# Patient Record
Sex: Female | Born: 1980
Health system: Southern US, Community
[De-identification: ages and names within clinical notes are randomized; demographics above are authoritative.]

## PROBLEM LIST (undated history)

## (undated) DIAGNOSIS — R87619 Unspecified abnormal cytological findings in specimens from cervix uteri: Secondary | ICD-10-CM

## (undated) DIAGNOSIS — M069 Rheumatoid arthritis, unspecified: Secondary | ICD-10-CM

## (undated) DIAGNOSIS — F32A Depression, unspecified: Secondary | ICD-10-CM

## (undated) DIAGNOSIS — I10 Essential (primary) hypertension: Secondary | ICD-10-CM

## (undated) DIAGNOSIS — G43909 Migraine, unspecified, not intractable, without status migrainosus: Secondary | ICD-10-CM

## (undated) DIAGNOSIS — J189 Pneumonia, unspecified organism: Secondary | ICD-10-CM

## (undated) DIAGNOSIS — I7300 Raynaud's syndrome without gangrene: Secondary | ICD-10-CM

## (undated) DIAGNOSIS — IMO0002 Reserved for concepts with insufficient information to code with codable children: Secondary | ICD-10-CM

## (undated) DIAGNOSIS — R569 Unspecified convulsions: Secondary | ICD-10-CM

## (undated) HISTORY — PX: WISDOM TOOTH EXTRACTION: SHX21

## (undated) HISTORY — PX: BREAST ENHANCEMENT SURGERY: SHX7

## (undated) HISTORY — PX: ANKLE SURGERY: SHX546

## (undated) HISTORY — PX: AUGMENTATION MAMMAPLASTY: SUR837

## (undated) HISTORY — PX: VAGINAL HYSTERECTOMY: SHX2639

## (undated) HISTORY — DX: Rheumatoid arthritis, unspecified: M06.9

## (undated) HISTORY — PX: OTHER SURGICAL HISTORY: SHX169

## (undated) HISTORY — PX: UPPER GASTROINTESTINAL ENDOSCOPY: SHX188

## (undated) HISTORY — DX: Pneumonia, unspecified organism: J18.9

## (undated) HISTORY — DX: Unspecified convulsions: R56.9

## (undated) HISTORY — PX: TUBAL LIGATION: SHX77

## (undated) HISTORY — DX: Depression, unspecified: F32.A

---
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
## 2002-09-19 ENCOUNTER — Other Ambulatory Visit: Admission: RE | Admit: 2002-09-19 | Discharge: 2002-09-19 | Payer: Self-pay | Admitting: Obstetrics and Gynecology

## 2004-11-12 ENCOUNTER — Other Ambulatory Visit: Admission: RE | Admit: 2004-11-12 | Discharge: 2004-11-12 | Payer: Self-pay | Admitting: Obstetrics and Gynecology

## 2006-11-01 ENCOUNTER — Ambulatory Visit (HOSPITAL_COMMUNITY): Admission: RE | Admit: 2006-11-01 | Discharge: 2006-11-01 | Payer: Self-pay | Admitting: Obstetrics and Gynecology

## 2007-10-24 ENCOUNTER — Inpatient Hospital Stay (HOSPITAL_COMMUNITY): Admission: AD | Admit: 2007-10-24 | Discharge: 2007-10-27 | Payer: Self-pay | Admitting: Obstetrics and Gynecology

## 2010-07-27 HISTORY — PX: OTHER SURGICAL HISTORY: SHX169

## 2011-02-12 ENCOUNTER — Other Ambulatory Visit (HOSPITAL_COMMUNITY): Payer: Self-pay | Admitting: Gynecology

## 2011-02-12 DIAGNOSIS — N979 Female infertility, unspecified: Secondary | ICD-10-CM

## 2011-02-17 ENCOUNTER — Ambulatory Visit (HOSPITAL_COMMUNITY)
Admission: RE | Admit: 2011-02-17 | Discharge: 2011-02-17 | Disposition: A | Discharge status: Home / Self Care | Payer: 59 | Source: Ambulatory Visit | Attending: Gynecology | Admitting: Gynecology

## 2011-02-17 DIAGNOSIS — N979 Female infertility, unspecified: Secondary | ICD-10-CM | POA: Insufficient documentation

## 2011-02-17 MED ORDER — IOHEXOL 300 MG/ML  SOLN: 30.0000 mL | Freq: Once | INTRAMUSCULAR | Status: AC | PRN

## 2011-04-20 LAB — CBC
Hemoglobin: 10.9 — ABNORMAL LOW
MCHC: 35.9
MCV: 94.4
RBC: 3.23 — ABNORMAL LOW

## 2011-04-20 LAB — URINALYSIS, ROUTINE W REFLEX MICROSCOPIC
Bilirubin Urine: NEGATIVE
Hgb urine dipstick: NEGATIVE
Specific Gravity, Urine: 1.015
pH: 7

## 2011-04-21 LAB — CBC
HCT: 27.5 — ABNORMAL LOW
MCHC: 35.8
MCV: 94.1
Platelets: 139 — ABNORMAL LOW
RDW: 11.9

## 2011-09-11 ENCOUNTER — Other Ambulatory Visit: Payer: Self-pay

## 2011-09-11 LAB — OB RESULTS CONSOLE GC/CHLAMYDIA: Chlamydia: NEGATIVE

## 2011-09-11 LAB — OB RESULTS CONSOLE HEPATITIS B SURFACE ANTIGEN: Hepatitis B Surface Ag: NEGATIVE

## 2011-09-11 LAB — OB RESULTS CONSOLE GBS: GBS: POSITIVE

## 2011-09-11 LAB — OB RESULTS CONSOLE ANTIBODY SCREEN: Antibody Screen: NEGATIVE

## 2011-09-11 LAB — OB RESULTS CONSOLE ABO/RH: RH Type: POSITIVE

## 2011-11-10 ENCOUNTER — Other Ambulatory Visit (HOSPITAL_COMMUNITY): Payer: Self-pay | Admitting: Obstetrics and Gynecology

## 2011-11-10 DIAGNOSIS — Z0489 Encounter for examination and observation for other specified reasons: Secondary | ICD-10-CM

## 2011-11-10 DIAGNOSIS — IMO0002 Reserved for concepts with insufficient information to code with codable children: Secondary | ICD-10-CM

## 2011-11-24 ENCOUNTER — Encounter (HOSPITAL_COMMUNITY): Payer: Self-pay

## 2011-11-24 ENCOUNTER — Ambulatory Visit (HOSPITAL_COMMUNITY)
Admission: RE | Admit: 2011-11-24 | Discharge: 2011-11-24 | Disposition: A | Discharge status: Home / Self Care | Payer: 59 | Source: Ambulatory Visit | Attending: Obstetrics and Gynecology | Admitting: Obstetrics and Gynecology

## 2011-11-24 DIAGNOSIS — Z1389 Encounter for screening for other disorder: Secondary | ICD-10-CM | POA: Insufficient documentation

## 2011-11-24 DIAGNOSIS — Z0489 Encounter for examination and observation for other specified reasons: Secondary | ICD-10-CM

## 2011-11-24 DIAGNOSIS — Z363 Encounter for antenatal screening for malformations: Secondary | ICD-10-CM | POA: Insufficient documentation

## 2011-11-24 DIAGNOSIS — O43899 Other placental disorders, unspecified trimester: Secondary | ICD-10-CM | POA: Insufficient documentation

## 2011-11-24 DIAGNOSIS — IMO0002 Reserved for concepts with insufficient information to code with codable children: Secondary | ICD-10-CM

## 2011-11-24 DIAGNOSIS — O358XX0 Maternal care for other (suspected) fetal abnormality and damage, not applicable or unspecified: Secondary | ICD-10-CM | POA: Insufficient documentation

## 2011-11-24 HISTORY — DX: Migraine, unspecified, not intractable, without status migrainosus: G43.909

## 2011-11-24 HISTORY — DX: Unspecified abnormal cytological findings in specimens from cervix uteri: R87.619

## 2011-11-24 HISTORY — DX: Reserved for concepts with insufficient information to code with codable children: IMO0002

## 2012-03-22 ENCOUNTER — Inpatient Hospital Stay (HOSPITAL_COMMUNITY)
Admission: AD | Admit: 2012-03-22 | Discharge: 2012-03-25 | DRG: 775 | Disposition: A | Discharge status: Home / Self Care | Payer: 59 | Source: Ambulatory Visit | Attending: Obstetrics and Gynecology | Admitting: Obstetrics and Gynecology

## 2012-03-22 ENCOUNTER — Inpatient Hospital Stay (HOSPITAL_COMMUNITY): Payer: 59

## 2012-03-22 ENCOUNTER — Encounter (HOSPITAL_COMMUNITY): Payer: Self-pay | Admitting: *Deleted

## 2012-03-22 DIAGNOSIS — O99892 Other specified diseases and conditions complicating childbirth: Secondary | ICD-10-CM | POA: Diagnosis present

## 2012-03-22 DIAGNOSIS — O26899 Other specified pregnancy related conditions, unspecified trimester: Secondary | ICD-10-CM

## 2012-03-22 DIAGNOSIS — Z22330 Carrier of Group B streptococcus: Secondary | ICD-10-CM

## 2012-03-22 LAB — CBC WITH DIFFERENTIAL/PLATELET
Basophils Relative: 0 % (ref 0–1)
Eosinophils Absolute: 0.1 10*3/uL (ref 0.0–0.7)
Hemoglobin: 11.6 g/dL — ABNORMAL LOW (ref 12.0–15.0)
MCH: 30.8 pg (ref 26.0–34.0)
MCHC: 34.4 g/dL (ref 30.0–36.0)
Monocytes Relative: 6 % (ref 3–12)
Neutrophils Relative %: 73 % (ref 43–77)
Platelets: 139 10*3/uL — ABNORMAL LOW (ref 150–400)

## 2012-03-22 LAB — LIPASE, BLOOD: Lipase: 31 U/L (ref 11–59)

## 2012-03-22 LAB — COMPREHENSIVE METABOLIC PANEL
Albumin: 2.3 g/dL — ABNORMAL LOW (ref 3.5–5.2)
Alkaline Phosphatase: 215 U/L — ABNORMAL HIGH (ref 39–117)
BUN: 5 mg/dL — ABNORMAL LOW (ref 6–23)
Calcium: 8.9 mg/dL (ref 8.4–10.5)
Creatinine, Ser: 0.79 mg/dL (ref 0.50–1.10)
Potassium: 4.7 mEq/L (ref 3.5–5.1)
Total Protein: 6 g/dL (ref 6.0–8.3)

## 2012-03-22 LAB — URINALYSIS, ROUTINE W REFLEX MICROSCOPIC
Bilirubin Urine: NEGATIVE
Ketones, ur: NEGATIVE mg/dL
Nitrite: NEGATIVE
Urobilinogen, UA: 0.2 mg/dL (ref 0.0–1.0)

## 2012-03-22 MED ORDER — ONDANSETRON HCL 4 MG/2ML IJ SOLN
4.0000 mg | Freq: Once | INTRAMUSCULAR | Status: AC
  Administered 2012-03-22: 4 mg via INTRAVENOUS
  Filled 2012-03-22: qty 2

## 2012-03-22 MED ORDER — LACTATED RINGERS IV SOLN
INTRAVENOUS | Status: DC
  Administered 2012-03-22 – 2012-03-23 (×2): via INTRAVENOUS
  Administered 2012-03-23: 125 mL/h via INTRAVENOUS

## 2012-03-22 MED ORDER — OXYCODONE-ACETAMINOPHEN 5-325 MG PO TABS
1.0000 | ORAL_TABLET | Freq: Once | ORAL | Status: AC
  Administered 2012-03-22: 1 via ORAL
  Filled 2012-03-22: qty 1

## 2012-03-22 MED ORDER — HYDROMORPHONE HCL PF 1 MG/ML IJ SOLN
0.5000 mg | Freq: Once | INTRAMUSCULAR | Status: AC
  Administered 2012-03-22: 0.5 mg via INTRAVENOUS
  Filled 2012-03-22: qty 1

## 2012-03-22 NOTE — MAU Provider Note (Signed)
History     CSN: [id number]  Arrival date and time: 03/22/12 2051   None     Chief Complaint  Patient presents with  . Abdominal Pain   HPI Mary Dean is a 31 y.o. female @ [redacted]w[redacted]d gestation who presents to MAU with abdominal pain. The pain started approximately 4 pm and is located on the right side of the abdomen. The pain radiates to the right leg. She rates the pain 6/10. The pain is constant. She describes the pain as a constant cramp. Associated symptoms include nausea and vomiting and headache. Last ate approximately 1:30 pm and had pizza. Since then has had apple juice but to nauseated to try anything else.  Denies vaginal bleeding or leaking of fluid. The history was provided by the patient.  OB History    Grav Para Term Preterm Abortions TAB SAB Ect Mult Living   2 1 1  0 0 0 0 0 0 1      Past Medical History  Diagnosis Date  . Migraines   . Abnormal Pap smear     Past Surgical History  Procedure Date  . Wisdom tooth extraction     History reviewed. No pertinent family history.  History  Substance Use Topics  . Smoking status: Never Smoker   . Smokeless tobacco: Not on file  . Alcohol Use: No    Allergies: No Known Allergies  Prescriptions prior to admission  Medication Sig Dispense Refill  . folic acid (FOLVITE) 800 MCG tablet Take 800 mcg by mouth at bedtime.      Marland Kitchen HYDROcodone-acetaminophen (NORCO/VICODIN) 5-325 MG per tablet Take 1 tablet by mouth every 6 (six) hours as needed. For migraine      . Prenatal Vit-Fe Fumarate-FA (PRENATAL MULTIVITAMIN) TABS Take 1 tablet by mouth at bedtime.      Marland Kitchen zolpidem (AMBIEN) 10 MG tablet Take 5 mg by mouth at bedtime as needed. For sleep        Review of Systems  Constitutional: Positive for chills. Negative for fever and weight loss.  HENT: Negative for ear pain, nosebleeds, congestion, sore throat and neck pain.   Eyes: Negative for blurred vision, double vision, photophobia and pain.  Respiratory: Negative  for cough, shortness of breath and wheezing.   Cardiovascular: Negative for chest pain, palpitations and leg swelling.  Gastrointestinal: Positive for heartburn, nausea, vomiting and abdominal pain. Negative for diarrhea and constipation.  Genitourinary: Positive for frequency. Negative for dysuria and urgency.  Musculoskeletal: Positive for back pain. Negative for myalgias.  Skin: Negative for itching and rash.  Neurological: Positive for headaches. Negative for dizziness, sensory change, speech change, seizures and weakness.  Endo/Heme/Allergies: Does not bruise/bleed easily.  Psychiatric/Behavioral: Negative for depression. The patient is not nervous/anxious.    Physical Exam   Blood pressure 120/78, pulse 103, temperature 97.9 F (36.6 C), temperature source Oral, resp. rate 18, height 5\' 3"  (1.6 m), weight 133 lb (60.328 kg), last menstrual period 07/08/2011, SpO2 100.00%.  Physical Exam  Nursing note and vitals reviewed. Constitutional: She is oriented to person, place, and time. She appears well-developed and well-nourished. No distress.  HENT:  Head: Normocephalic and atraumatic.  Eyes: EOM are normal.  Neck: Neck supple.  Cardiovascular:       tachycardia  Respiratory: Effort normal.  GI: Soft. There is tenderness.       Tender with palpation right side of abdomen. No guarding or rebound.   Genitourinary:       Dilation: Fingertip Effacement (%):  70 Cervical Position: Middle Station: -3 Exam by:: L. Munford RN  Musculoskeletal: Normal range of motion.  Neurological: She is alert and oriented to person, place, and time.  Skin: Skin is warm and dry.  Psychiatric: She has a normal mood and affect. Her behavior is normal. Judgment and thought content normal.   Contracting every 3 to 7 minutes, no decelerations, reactive tracing. MAU Course  Procedures   Assessment and Plan: discussed with Dr. Marcelle Overlie and will give patient percocet for pain and ultrasound.   Results  for orders placed during the hospital encounter of 03/22/12 (from the past 24 hour(s))  URINALYSIS, ROUTINE W REFLEX MICROSCOPIC     Status: Abnormal   Collection Time   03/22/12  8:57 PM      Component Value Range   Color, Urine YELLOW  YELLOW   APPearance CLEAR  CLEAR   Specific Gravity, Urine <1.005 (*) 1.005 - 1.030   pH 6.0  5.0 - 8.0   Glucose, UA NEGATIVE  NEGATIVE mg/dL   Hgb urine dipstick NEGATIVE  NEGATIVE   Bilirubin Urine NEGATIVE  NEGATIVE   Ketones, ur NEGATIVE  NEGATIVE mg/dL   Protein, ur NEGATIVE  NEGATIVE mg/dL   Urobilinogen, UA 0.2  0.0 - 1.0 mg/dL   Nitrite NEGATIVE  NEGATIVE   Leukocytes, UA NEGATIVE  NEGATIVE  CBC WITH DIFFERENTIAL     Status: Abnormal   Collection Time   03/22/12  9:40 PM      Component Value Range   WBC 11.1 (*) 4.0 - 10.5 K/uL   RBC 3.77 (*) 3.87 - 5.11 MIL/uL   Hemoglobin 11.6 (*) 12.0 - 15.0 g/dL   HCT 16.1 (*) 09.6 - 04.5 %   MCV 89.4  78.0 - 100.0 fL   MCH 30.8  26.0 - 34.0 pg   MCHC 34.4  30.0 - 36.0 g/dL   RDW 40.9  81.1 - 91.4 %   Platelets 139 (*) 150 - 400 K/uL   Neutrophils Relative 73  43 - 77 %   Neutro Abs 8.1 (*) 1.7 - 7.7 K/uL   Lymphocytes Relative 20  12 - 46 %   Lymphs Abs 2.2  0.7 - 4.0 K/uL   Monocytes Relative 6  3 - 12 %   Monocytes Absolute 0.7  0.1 - 1.0 K/uL   Eosinophils Relative 1  0 - 5 %   Eosinophils Absolute 0.1  0.0 - 0.7 K/uL   Basophils Relative 0  0 - 1 %   Basophils Absolute 0.0  0.0 - 0.1 K/uL  COMPREHENSIVE METABOLIC PANEL     Status: Abnormal   Collection Time   03/22/12  9:40 PM      Component Value Range   Sodium 137  135 - 145 mEq/L   Potassium 4.7  3.5 - 5.1 mEq/L   Chloride 103  96 - 112 mEq/L   CO2 21  19 - 32 mEq/L   Glucose, Bld 73  70 - 99 mg/dL   BUN 5 (*) 6 - 23 mg/dL   Creatinine, Ser 7.82  0.50 - 1.10 mg/dL   Calcium 8.9  8.4 - 95.6 mg/dL   Total Protein 6.0  6.0 - 8.3 g/dL   Albumin 2.3 (*) 3.5 - 5.2 g/dL   AST 46 (*) 0 - 37 U/L   ALT 15  0 - 35 U/L   Alkaline  Phosphatase 215 (*) 39 - 117 U/L   Total Bilirubin 0.3  0.3 - 1.2 mg/dL  GFR calc non Af Amer >90  >90 mL/min   GFR calc Af Amer >90  >90 mL/min  LIPASE, BLOOD     Status: Normal   Collection Time   03/22/12  9:40 PM      Component Value Range   Lipase 31  11 - 59 U/L   US Abdomen Complete  03/22/2012  *RADIOLOGY REPORT*  Clinical Data:  Pregnant patient with elevated liver function tests.  COMPLETE ABDOMINAL ULTRASOUND  Comparison:  None.  Findings:  Gallbladder:  No gallstones, gallbladder wall thickening, or pericholecystic fluid.  Common bile duct:  Measures 0.1 cm.  Liver:  No focal lesion identified.  Within normal limits in parenchymal echogenicity.  IVC:  Appears normal.  Pancreas:  No focal abnormality seen.  Spleen:  Measures 7.8 cm and appears normal.  Right Kidney:  Measures 9.3 cm and appears normal.  Left Kidney:  Measures 9.1 cm and appears normal.  Abdominal aorta:  No aneurysm identified.  IMPRESSION: Negative abdominal ultrasound.   Original Report Authenticated By: Bernadene Bell. Maricela Curet, M.D.    Patient returned from ultrasound and states that Percocet has not touched the pain. Will give Dilaudid 0.5 mg IV and Zofran 4 mg. IV for nausea. Dr. Marcelle Overlie notified of lab and ultrasound results and patient continued pain. He will write admission orders. Medical screening exam complete and Dr. Marcelle Overlie to take over care of the patient.  NEESE,HOPE,RN, FNP, BC 03/23/2012, 12:13 AM

## 2012-03-22 NOTE — MAU Note (Signed)
Pt reports pain on right side, radiating to rt lower back and down rt leg. Nausea and vomiting since pain started at 1600

## 2012-03-23 ENCOUNTER — Encounter (HOSPITAL_COMMUNITY): Payer: Self-pay | Admitting: Anesthesiology

## 2012-03-23 ENCOUNTER — Encounter (HOSPITAL_COMMUNITY): Payer: Self-pay | Admitting: *Deleted

## 2012-03-23 ENCOUNTER — Inpatient Hospital Stay (HOSPITAL_COMMUNITY): Payer: 59 | Admitting: Anesthesiology

## 2012-03-23 LAB — CBC
MCV: 89.5 fL (ref 78.0–100.0)
Platelets: 127 10*3/uL — ABNORMAL LOW (ref 150–400)
RDW: 12.7 % (ref 11.5–15.5)
WBC: 10 10*3/uL (ref 4.0–10.5)

## 2012-03-23 LAB — COMPREHENSIVE METABOLIC PANEL
Albumin: 1.9 g/dL — ABNORMAL LOW (ref 3.5–5.2)
Alkaline Phosphatase: 187 U/L — ABNORMAL HIGH (ref 39–117)
BUN: 4 mg/dL — ABNORMAL LOW (ref 6–23)
CO2: 22 mEq/L (ref 19–32)
Chloride: 105 mEq/L (ref 96–112)
GFR calc Af Amer: >90 mL/min (ref >90)
GFR calc non Af Amer: >90 mL/min (ref >90)
Glucose, Bld: 88 mg/dL (ref 70–99)
Potassium: 3.6 mEq/L (ref 3.5–5.1)
Total Bilirubin: 0.2 mg/dL — ABNORMAL LOW (ref 0.3–1.2)

## 2012-03-23 MED ORDER — BUTORPHANOL TARTRATE 1 MG/ML IJ SOLN: 1.0000 mg | Freq: Once | INTRAMUSCULAR | Status: DC

## 2012-03-23 MED ORDER — ONDANSETRON HCL 4 MG/2ML IJ SOLN: 4.0000 mg | Freq: Four times a day (QID) | INTRAMUSCULAR | Status: DC | PRN

## 2012-03-23 MED ORDER — LIDOCAINE HCL (PF) 1 % IJ SOLN
INTRAMUSCULAR | Status: DC | PRN
  Administered 2012-03-23 (×3): 4 mL

## 2012-03-23 MED ORDER — FENTANYL 2.5 MCG/ML BUPIVACAINE 1/10 % EPIDURAL INFUSION (WH - ANES)
14.0000 mL/h | INTRAMUSCULAR | Status: DC
  Administered 2012-03-23 (×3): 14 mL/h via EPIDURAL
  Filled 2012-03-23 (×4): qty 60

## 2012-03-23 MED ORDER — DIPHENHYDRAMINE HCL 50 MG/ML IJ SOLN
12.5000 mg | Freq: Four times a day (QID) | INTRAMUSCULAR | Status: DC | PRN
  Filled 2012-03-23 (×4): qty 1

## 2012-03-23 MED ORDER — NALBUPHINE SYRINGE 5 MG/0.5 ML
5.0000 mg | INJECTION | INTRAMUSCULAR | Status: DC | PRN
  Administered 2012-03-23: 5 mg via INTRAVENOUS

## 2012-03-23 MED ORDER — TERBUTALINE SULFATE 1 MG/ML IJ SOLN: 0.2500 mg | Freq: Once | INTRAMUSCULAR | Status: DC | PRN

## 2012-03-23 MED ORDER — LACTATED RINGERS IV SOLN
INTRAVENOUS | Status: DC
  Administered 2012-03-23: 20:00:00 via INTRAVENOUS

## 2012-03-23 MED ORDER — EPHEDRINE 5 MG/ML INJ
10.0000 mg | INTRAVENOUS | Status: DC | PRN
  Filled 2012-03-23: qty 4

## 2012-03-23 MED ORDER — OXYTOCIN 40 UNITS IN LACTATED RINGERS INFUSION - SIMPLE MED: 62.5000 mL/h | Freq: Once | INTRAVENOUS | Status: DC

## 2012-03-23 MED ORDER — LACTATED RINGERS IV SOLN
500.0000 mL | INTRAVENOUS | Status: DC | PRN
  Administered 2012-03-23 (×2): 500 mL via INTRAVENOUS

## 2012-03-23 MED ORDER — PENICILLIN G POTASSIUM 5000000 UNITS IJ SOLR
5.0000 10*6.[IU] | Freq: Once | INTRAVENOUS | Status: AC
  Administered 2012-03-23: 5 10*6.[IU] via INTRAVENOUS
  Filled 2012-03-23: qty 5

## 2012-03-23 MED ORDER — IBUPROFEN 600 MG PO TABS: 600.0000 mg | ORAL_TABLET | Freq: Four times a day (QID) | ORAL | Status: DC | PRN

## 2012-03-23 MED ORDER — ACETAMINOPHEN 325 MG PO TABS: 650.0000 mg | ORAL_TABLET | ORAL | Status: DC | PRN

## 2012-03-23 MED ORDER — NALBUPHINE SYRINGE 5 MG/0.5 ML
5.0000 mg | INJECTION | INTRAMUSCULAR | Status: DC | PRN
  Filled 2012-03-23: qty 0.5

## 2012-03-23 MED ORDER — CITRIC ACID-SODIUM CITRATE 334-500 MG/5ML PO SOLN: 30.0000 mL | ORAL | Status: DC | PRN

## 2012-03-23 MED ORDER — PROMETHAZINE HCL 25 MG/ML IJ SOLN
12.5000 mg | Freq: Once | INTRAMUSCULAR | Status: AC
  Administered 2012-03-23: 12.5 mg via INTRAVENOUS
  Filled 2012-03-23: qty 1

## 2012-03-23 MED ORDER — EPHEDRINE 5 MG/ML INJ: 10.0000 mg | INTRAVENOUS | Status: DC | PRN

## 2012-03-23 MED ORDER — FLEET ENEMA 7-19 GM/118ML RE ENEM: 1.0000 | ENEMA | RECTAL | Status: DC | PRN

## 2012-03-23 MED ORDER — OXYTOCIN 40 UNITS IN LACTATED RINGERS INFUSION - SIMPLE MED
1.0000 m[IU]/min | INTRAVENOUS | Status: DC
  Administered 2012-03-23: 8 m[IU]/min via INTRAVENOUS
  Administered 2012-03-23: 4 m[IU]/min via INTRAVENOUS
  Administered 2012-03-23: 2 m[IU]/min via INTRAVENOUS
  Administered 2012-03-23: 6 m[IU]/min via INTRAVENOUS
  Filled 2012-03-23: qty 1000

## 2012-03-23 MED ORDER — SODIUM CHLORIDE 0.9 % IJ SOLN: 9.0000 mL | INTRAMUSCULAR | Status: DC | PRN

## 2012-03-23 MED ORDER — AMOXICILLIN-POT CLAVULANATE 500-125 MG PO TABS
1.0000 | ORAL_TABLET | Freq: Two times a day (BID) | ORAL | Status: AC
  Administered 2012-03-23 – 2012-03-25 (×4): 500 mg via ORAL
  Filled 2012-03-23 (×4): qty 1

## 2012-03-23 MED ORDER — PENICILLIN G POTASSIUM 5000000 UNITS IJ SOLR
2.5000 10*6.[IU] | INTRAVENOUS | Status: DC
  Administered 2012-03-23 (×2): 2.5 10*6.[IU] via INTRAVENOUS
  Filled 2012-03-23 (×4): qty 2.5

## 2012-03-23 MED ORDER — PHENYLEPHRINE 40 MCG/ML (10ML) SYRINGE FOR IV PUSH (FOR BLOOD PRESSURE SUPPORT): 80.0000 ug | PREFILLED_SYRINGE | INTRAVENOUS | Status: DC | PRN

## 2012-03-23 MED ORDER — MORPHINE SULFATE (PF) 1 MG/ML IV SOLN
INTRAVENOUS | Status: DC
  Administered 2012-03-23: 02:00:00 via INTRAVENOUS
  Administered 2012-03-23: 5 mg via INTRAVENOUS
  Filled 2012-03-23: qty 25

## 2012-03-23 MED ORDER — PHENYLEPHRINE 40 MCG/ML (10ML) SYRINGE FOR IV PUSH (FOR BLOOD PRESSURE SUPPORT)
80.0000 ug | PREFILLED_SYRINGE | INTRAVENOUS | Status: DC | PRN
  Filled 2012-03-23: qty 5

## 2012-03-23 MED ORDER — BUTORPHANOL TARTRATE 1 MG/ML IJ SOLN
1.0000 mg | INTRAMUSCULAR | Status: DC | PRN
  Administered 2012-03-23: 1 mg via INTRAVENOUS
  Filled 2012-03-23: qty 1

## 2012-03-23 MED ORDER — LACTATED RINGERS IV SOLN
500.0000 mL | Freq: Once | INTRAVENOUS | Status: AC
  Administered 2012-03-23: 500 mL via INTRAVENOUS

## 2012-03-23 MED ORDER — NALOXONE HCL 0.4 MG/ML IJ SOLN: 0.4000 mg | INTRAMUSCULAR | Status: DC | PRN

## 2012-03-23 MED ORDER — BUTORPHANOL TARTRATE 1 MG/ML IJ SOLN
1.0000 mg | Freq: Once | INTRAMUSCULAR | Status: AC
  Administered 2012-03-23: 1 mg via INTRAVENOUS
  Filled 2012-03-23: qty 1

## 2012-03-23 MED ORDER — DIPHENHYDRAMINE HCL 50 MG/ML IJ SOLN
12.5000 mg | INTRAMUSCULAR | Status: AC | PRN
  Administered 2012-03-23 (×3): 12.5 mg via INTRAVENOUS

## 2012-03-23 MED ORDER — OXYTOCIN BOLUS FROM INFUSION
250.0000 mL | Freq: Once | INTRAVENOUS | Status: DC
  Filled 2012-03-23: qty 500

## 2012-03-23 MED ORDER — DIPHENHYDRAMINE HCL 12.5 MG/5ML PO ELIX
12.5000 mg | ORAL_SOLUTION | Freq: Four times a day (QID) | ORAL | Status: DC | PRN
  Filled 2012-03-23: qty 5

## 2012-03-23 MED ORDER — LIDOCAINE HCL (PF) 1 % IJ SOLN
30.0000 mL | INTRAMUSCULAR | Status: DC | PRN
  Filled 2012-03-23: qty 30

## 2012-03-23 MED ORDER — OXYCODONE-ACETAMINOPHEN 5-325 MG PO TABS: 1.0000 | ORAL_TABLET | ORAL | Status: DC | PRN

## 2012-03-23 NOTE — Progress Notes (Signed)
Pt comfortable  FHT reassuring Toco Q 3-4 Cvx 4.5cm/60/-2 Foley just placed by RN w/ >500cc output  A/P: recheck cvx in 1hr, if no change will consider augment w/ pitocin

## 2012-03-23 NOTE — Progress Notes (Signed)
SVD of vigerous female infant w/ apgars of 8,9.  Placenta delivered spontaneous w/ 3VC.   2nd degree lac repaired w/ 3-0 vicryl rapide.  Fundus firm.  EBL 450cc .  Mom and baby doing well in LDR.

## 2012-03-23 NOTE — Anesthesia Preprocedure Evaluation (Signed)
Anesthesia Evaluation  Patient identified by MRN, date of birth, ID band Patient awake    Reviewed: Allergy & Precautions, H&P , NPO status , Patient's Chart, lab work & pertinent test results, reviewed documented beta blocker date and time   History of Anesthesia Complications Negative for: history of anesthetic complications  Airway Mallampati: II TM Distance: >3 FB Neck ROM: full  Mouth opening: Limited Mouth Opening  Dental  (+) Teeth Intact   Pulmonary neg pulmonary ROS,  breath sounds clear to auscultation        Cardiovascular negative cardio ROS  Rhythm:regular Rate:Normal     Neuro/Psych  Headaches (migraines - takes vicodin.  last took last week), negative psych ROS   GI/Hepatic negative GI ROS, Neg liver ROS,   Endo/Other  negative endocrine ROS  Renal/GU negative Renal ROS  negative genitourinary   Musculoskeletal   Abdominal   Peds  Hematology  (+) Blood dyscrasia (thrombocytopenia - plt count 127), ,   Anesthesia Other Findings   Reproductive/Obstetrics (+) Pregnancy                           Anesthesia Physical Anesthesia Plan  ASA: II  Anesthesia Plan: Epidural   Post-op Pain Management:    Induction:   Airway Management Planned:   Additional Equipment:   Intra-op Plan:   Post-operative Plan:   Informed Consent: I have reviewed the patients History and Physical, chart, labs and discussed the procedure including the risks, benefits and alternatives for the proposed anesthesia with the patient or authorized representative who has indicated his/her understanding and acceptance.     Plan Discussed with:   Anesthesia Plan Comments:         Anesthesia Quick Evaluation

## 2012-03-23 NOTE — MAU Note (Signed)
Dr. Marcelle Overlie notified of labs results and ultrasound report. Patient pain not controlled with percocet. Pain decreased after dilaudid 0.5mg  IV. Orders received to admit patient for observation and pain management.

## 2012-03-23 NOTE — Progress Notes (Signed)
Pt continues w/ severe RLQ pain that radiates to right flank.  Pain is not relieved w/ PCA, slight relief w/ stadol.  No vb.  Pt reports this feels similar to when she went into labor w/ previous pregnancy.  AF, VSS Gen - appears uncomfortable Abd - gravid, NT Cvx 1cm per dr holland's exam at 7am (change from closed)  A/P:  Pain, suspect latent labor D/c PCA Stadol prn Recheck cvx at 9am - if cervical change, will tx to L&D for epidural & labor mngt

## 2012-03-23 NOTE — Progress Notes (Signed)
Pt comfortable w/ epidural  FHT reassuring Toco Q2-3 Cvx c/c/+1  A/P:  Will start pushing

## 2012-03-23 NOTE — Anesthesia Procedure Notes (Signed)
Epidural Patient location during procedure: OB Start time: 03/23/2012 11:11 AM Reason for block: procedure for pain  Staffing Performed by: anesthesiologist   Preanesthetic Checklist Completed: patient identified, site marked, surgical consent, pre-op evaluation, timeout performed, IV checked, risks and benefits discussed and monitors and equipment checked  Epidural Patient position: sitting Prep: site prepped and draped and DuraPrep Patient monitoring: continuous pulse ox and blood pressure Approach: midline Injection technique: LOR air  Needle:  Needle type: Tuohy  Needle gauge: 17 G Needle length: 9 cm Needle insertion depth: 5 cm cm Catheter type: closed end flexible Catheter size: 19 Gauge Catheter at skin depth: 10 cm Test dose: negative  Assessment Events: blood not aspirated, injection not painful, no injection resistance, negative IV test and no paresthesia  Additional Notes Discussed risk of headache, infection, bleeding, nerve injury and failed or incomplete block.  Patient voices understanding and wishes to proceed.

## 2012-03-23 NOTE — Progress Notes (Signed)
Pt c/o itching from epidural, benedryl per Anesth standing order. Pt after a few minutes reports itching much better.

## 2012-03-23 NOTE — Progress Notes (Signed)
Pt requesting more pain meds.  No vb or lof  AF, VSS + FHT Gen - uncomfortable Abd - gravid, NT Cvx 3/50/-2  A/P:  Labor Transfer to L&D Epidural Stadol x 1 now until epidural

## 2012-03-23 NOTE — H&P (Signed)
Mary Dean is a 31 y.o. female presenting for eval of abd pain>>>seen in MAU with c/o suprapubic + RLQ pain, in MAU>>>US was normal, UA NEG, CMET + CBC WNL except for slight incr in SGOT  She received PO Percocet w/o signif relief, adm for further eval + pain mgmt. Maternal Medical History:  Contractions: Onset was 3-5 hours ago.   Frequency: irregular.    Fetal activity: Perceived fetal activity is normal.   Last perceived fetal movement was within the past hour.      OB History    Grav Para Term Preterm Abortions TAB SAB Ect Mult Living   2 1 1  0 0 0 0 0 0 1     Past Medical History  Diagnosis Date  . Migraines   . Abnormal Pap smear    Past Surgical History  Procedure Date  . Wisdom tooth extraction   . Ivf    Family History: family history includes Diabetes in her maternal grandmother and mother; Hypertension in her maternal grandmother; and Kidney disease in her maternal grandmother. Social History:  reports that she has never smoked. She has never used smokeless tobacco. She reports that she does not drink alcohol or use illicit drugs.   Prenatal Transfer Tool  Maternal Diabetes: No Genetic Screening: Normal Maternal Ultrasounds/Referrals: Normal Fetal Ultrasounds or other Referrals:  None Maternal Substance Abuse:  No Significant Maternal Medications:  None Significant Maternal Lab Results:  None Other Comments:  None  ROS  Dilation: 2 Effacement (%): 50 Station: -3 Exam by:: C Jones, RN Blood pressure 124/80, pulse 110, temperature 98.3 F (36.8 C), temperature source Oral, resp. rate 18, height 5\' 3"  (1.6 Dean), weight 133 lb (60.328 kg), last menstrual period 07/08/2011, SpO2 100.00%. Maternal Exam:  Uterine Assessment: Contraction strength is mild.  Abdomen: Patient reports the following abdominal tenderness: suprapubic.  Fundal height is term FH, FHR 148.   Estimated fetal weight is AGA.   Fetal presentation: vertex  Introitus: Normal vulva. Normal  vagina.  Pelvis: adequate for delivery.   Cervix: Cervix evaluated by digital exam.     Physical Exam  Constitutional: She appears well-developed and well-nourished.  HENT:  Head: Normocephalic.  Neck: Normal range of motion. Neck supple.  Cardiovascular: Normal rate and regular rhythm.   Respiratory: Effort normal and breath sounds normal.  GI: There is tenderness in the suprapubic area.       Tender suprapubic and RLQ, no rebound  Genitourinary:       cx FT on adm  Musculoskeletal: Normal range of motion.    Prenatal labs: ABO, Rh: A/Positive/-- (02/15 0000) Antibody: Negative (02/15 0000) Rubella: Immune (02/15 0000) RPR: Nonreactive (02/15 0000)  HBsAg: Negative (02/15 0000)  HIV: Non-reactive (02/15 0000)  GBS: Positive (02/15 0000)   Assessment/Plan: Term preg, abd pain , R/O labor, adm for cont monitoring + low dose MS PCA   Mary Dean 03/23/2012, 8:21 AM

## 2012-03-23 NOTE — Progress Notes (Signed)
Pt comfortable w/ epidural  FHT reassuring Toco Q3-4 Cvx 6/60/-2  A/P:  Continue pitocin augmentation Exp mngt

## 2012-03-23 NOTE — Progress Notes (Signed)
Mary Dean taking back care

## 2012-03-23 NOTE — Progress Notes (Signed)
Pt feeling pain relief w/ epidural.  FHT reassuring Toco  q5-6 Cvx 4/60/-2 AROM - clear  A/P:  Exp mngt PCN

## 2012-03-24 LAB — CBC
HCT: 22.9 % — ABNORMAL LOW (ref 36.0–46.0)
Hemoglobin: 8.1 g/dL — ABNORMAL LOW (ref 12.0–15.0)
MCV: 89.1 fL (ref 78.0–100.0)
RBC: 2.57 MIL/uL — ABNORMAL LOW (ref 3.87–5.11)
RDW: 12.4 % (ref 11.5–15.5)
WBC: 15.6 10*3/uL — ABNORMAL HIGH (ref 4.0–10.5)

## 2012-03-24 MED ORDER — WITCH HAZEL-GLYCERIN EX PADS: 1.0000 "application " | MEDICATED_PAD | CUTANEOUS | Status: DC | PRN

## 2012-03-24 MED ORDER — METHYLERGONOVINE MALEATE 0.2 MG/ML IJ SOLN: 0.2000 mg | INTRAMUSCULAR | Status: DC | PRN

## 2012-03-24 MED ORDER — DIBUCAINE 1 % RE OINT: 1.0000 "application " | TOPICAL_OINTMENT | RECTAL | Status: DC | PRN

## 2012-03-24 MED ORDER — SIMETHICONE 80 MG PO CHEW: 80.0000 mg | CHEWABLE_TABLET | ORAL | Status: DC | PRN

## 2012-03-24 MED ORDER — MEDROXYPROGESTERONE ACETATE 150 MG/ML IM SUSP: 150.0000 mg | INTRAMUSCULAR | Status: DC | PRN

## 2012-03-24 MED ORDER — MEASLES, MUMPS & RUBELLA VAC ~~LOC~~ INJ
0.5000 mL | INJECTION | Freq: Once | SUBCUTANEOUS | Status: DC
  Filled 2012-03-24: qty 0.5

## 2012-03-24 MED ORDER — IBUPROFEN 600 MG PO TABS
600.0000 mg | ORAL_TABLET | Freq: Four times a day (QID) | ORAL | Status: DC
  Administered 2012-03-24 – 2012-03-25 (×6): 600 mg via ORAL
  Filled 2012-03-24 (×6): qty 1

## 2012-03-24 MED ORDER — ONDANSETRON HCL 4 MG/2ML IJ SOLN: 4.0000 mg | INTRAMUSCULAR | Status: DC | PRN

## 2012-03-24 MED ORDER — METHYLERGONOVINE MALEATE 0.2 MG PO TABS: 0.2000 mg | ORAL_TABLET | ORAL | Status: DC | PRN

## 2012-03-24 MED ORDER — TETANUS-DIPHTH-ACELL PERTUSSIS 5-2.5-18.5 LF-MCG/0.5 IM SUSP: 0.5000 mL | Freq: Once | INTRAMUSCULAR | Status: DC

## 2012-03-24 MED ORDER — BENZOCAINE-MENTHOL 20-0.5 % EX AERO
1.0000 "application " | INHALATION_SPRAY | CUTANEOUS | Status: DC | PRN
  Filled 2012-03-24: qty 56

## 2012-03-24 MED ORDER — ONDANSETRON HCL 4 MG PO TABS
4.0000 mg | ORAL_TABLET | ORAL | Status: DC | PRN
  Administered 2012-03-24: 4 mg via ORAL
  Filled 2012-03-24: qty 1

## 2012-03-24 MED ORDER — DIPHENHYDRAMINE HCL 25 MG PO CAPS: 25.0000 mg | ORAL_CAPSULE | Freq: Four times a day (QID) | ORAL | Status: DC | PRN

## 2012-03-24 MED ORDER — SENNOSIDES-DOCUSATE SODIUM 8.6-50 MG PO TABS
2.0000 | ORAL_TABLET | Freq: Every day | ORAL | Status: DC
  Administered 2012-03-24: 2 via ORAL

## 2012-03-24 MED ORDER — FERROUS SULFATE 325 (65 FE) MG PO TABS
325.0000 mg | ORAL_TABLET | Freq: Three times a day (TID) | ORAL | Status: DC
  Administered 2012-03-24 – 2012-03-25 (×3): 325 mg via ORAL
  Filled 2012-03-24 (×4): qty 1

## 2012-03-24 MED ORDER — PRENATAL MULTIVITAMIN CH
1.0000 | ORAL_TABLET | Freq: Every day | ORAL | Status: DC
  Administered 2012-03-24 – 2012-03-25 (×2): 1 via ORAL
  Filled 2012-03-24 (×2): qty 1

## 2012-03-24 MED ORDER — OXYCODONE-ACETAMINOPHEN 5-325 MG PO TABS
1.0000 | ORAL_TABLET | ORAL | Status: DC | PRN
  Administered 2012-03-24 – 2012-03-25 (×8): 1 via ORAL
  Filled 2012-03-24 (×9): qty 1

## 2012-03-24 MED ORDER — LANOLIN HYDROUS EX OINT: TOPICAL_OINTMENT | CUTANEOUS | Status: DC | PRN

## 2012-03-24 NOTE — Anesthesia Postprocedure Evaluation (Signed)
  Anesthesia Post-op Note  Patient: Mary Dean  Procedure(s) Performed: * No procedures listed *  Patient Location: Mother/Baby  Anesthesia Type: Epidural  Level of Consciousness: awake and alert   Airway and Oxygen Therapy: Patient Spontanous Breathing  Post-op Pain: none  Post-op Assessment: Patient's Cardiovascular Status Stable, Respiratory Function Stable, Patent Airway, No signs of Nausea or vomiting, Adequate PO intake, Pain level controlled, No headache, No backache, No residual numbness and No residual motor weakness  Post-op Vital Signs: Reviewed and stable  Complications: No apparent anesthesia complications

## 2012-03-24 NOTE — Progress Notes (Signed)
Post Partum Day 1 Subjective: no complaints, up ad lib, voiding and tolerating PO  Objective: Blood pressure 117/78, pulse 96, temperature 97.8 F (36.6 C), temperature source Oral, resp. rate 18, height 5\' 3"  (1.6 m), weight 60.328 kg (133 lb), last menstrual period 07/08/2011, SpO2 98.00%, unknown if currently breastfeeding.  Physical Exam:  General: alert and cooperative Lochia: appropriate Uterine Fundus: firm Incision: perineum intact DVT Evaluation: No evidence of DVT seen on physical exam.   Basename 03/24/12 0540 03/23/12 0510  HGB 8.1* 10.4*  HCT 22.9* 29.7*    Assessment/Plan: Plan for discharge tomorrow FEso4   LOS: 2 days   Branndon Tuite G 03/24/2012, 8:08 AM

## 2012-03-25 ENCOUNTER — Ambulatory Visit (HOSPITAL_COMMUNITY): Payer: 59

## 2012-03-25 MED ORDER — FERROUS SULFATE 325 (65 FE) MG PO TABS: 325.0000 mg | ORAL_TABLET | Freq: Three times a day (TID) | ORAL | Status: DC

## 2012-03-25 MED ORDER — IBUPROFEN 600 MG PO TABS: 600.0000 mg | ORAL_TABLET | Freq: Four times a day (QID) | ORAL | Status: AC

## 2012-03-25 MED ORDER — OXYCODONE-ACETAMINOPHEN 5-325 MG PO TABS: 1.0000 | ORAL_TABLET | ORAL | Status: AC | PRN

## 2012-03-25 NOTE — Discharge Summary (Signed)
Obstetric Discharge Summary Reason for Admission: onset of labor Prenatal Procedures: ultrasound Intrapartum Procedures: spontaneous vaginal delivery Postpartum Procedures: none Complications-Operative and Postpartum: 2 degree perineal laceration Hemoglobin  Date Value Range Status  03/24/2012 8.1* 12.0 - 15.0 g/dL Final     DELTA CHECK NOTED     REPEATED TO VERIFY     HCT  Date Value Range Status  03/24/2012 22.9* 36.0 - 46.0 % Final    Physical Exam:  General: alert and cooperative Lochia: appropriate Uterine Fundus: firm Incision: perineum intact DVT Evaluation: No evidence of DVT seen on physical exam.  Discharge Diagnoses: Term Pregnancy-delivered  Discharge Information: Date: 03/25/2012 Activity: pelvic rest Diet: routine Medications: PNV and Ibuprofen Condition: stable Instructions: refer to practice specific booklet Discharge to: home   Newborn Data: Live born female  Birth Weight: 5 lb 11.5 oz (2594 g) APGAR: 8, 9  Home with mother.  Mary Dean G 03/25/2012, 8:16 AM

## 2012-03-30 ENCOUNTER — Ambulatory Visit (HOSPITAL_COMMUNITY): Payer: 59

## 2013-07-27 HISTORY — PX: BREAST ENHANCEMENT SURGERY: SHX7

## 2014-05-28 ENCOUNTER — Encounter (HOSPITAL_COMMUNITY): Payer: Self-pay | Admitting: *Deleted

## 2014-07-04 ENCOUNTER — Other Ambulatory Visit: Payer: Self-pay

## 2014-07-05 LAB — CYTOLOGY - PAP

## 2016-10-29 ENCOUNTER — Emergency Department (HOSPITAL_COMMUNITY)
Admission: EM | Admit: 2016-10-29 | Discharge: 2016-10-30 | Disposition: A | Discharge status: Home / Self Care | Payer: Managed Care, Other (non HMO) | Attending: Emergency Medicine | Admitting: Emergency Medicine

## 2016-10-29 ENCOUNTER — Encounter (HOSPITAL_COMMUNITY): Payer: Self-pay | Admitting: Emergency Medicine

## 2016-10-29 DIAGNOSIS — Z79899 Other long term (current) drug therapy: Secondary | ICD-10-CM | POA: Diagnosis not present

## 2016-10-29 DIAGNOSIS — R197 Diarrhea, unspecified: Secondary | ICD-10-CM | POA: Insufficient documentation

## 2016-10-29 DIAGNOSIS — R000 Tachycardia, unspecified: Secondary | ICD-10-CM

## 2016-10-29 DIAGNOSIS — G43409 Hemiplegic migraine, not intractable, without status migrainosus: Secondary | ICD-10-CM | POA: Diagnosis not present

## 2016-10-29 DIAGNOSIS — R51 Headache: Secondary | ICD-10-CM | POA: Diagnosis present

## 2016-10-29 DIAGNOSIS — R112 Nausea with vomiting, unspecified: Secondary | ICD-10-CM | POA: Diagnosis not present

## 2016-10-29 DIAGNOSIS — I10 Essential (primary) hypertension: Secondary | ICD-10-CM | POA: Diagnosis not present

## 2016-10-29 HISTORY — DX: Raynaud's syndrome without gangrene: I73.00

## 2016-10-29 HISTORY — DX: Essential (primary) hypertension: I10

## 2016-10-29 LAB — CBC WITH DIFFERENTIAL/PLATELET
BASOS ABS: 0 10*3/uL (ref 0.0–0.1)
Basophils Relative: 0 %
Eosinophils Absolute: 0 10*3/uL (ref 0.0–0.7)
Eosinophils Relative: 0 %
HEMATOCRIT: 37.3 % (ref 36.0–46.0)
Hemoglobin: 13.6 g/dL (ref 12.0–15.0)
LYMPHS ABS: 1.8 10*3/uL (ref 0.7–4.0)
Lymphocytes Relative: 14 %
MCH: 31.9 pg (ref 26.0–34.0)
MCHC: 36.5 g/dL — AB (ref 30.0–36.0)
MCV: 87.6 fL (ref 78.0–100.0)
MONO ABS: 0.6 10*3/uL (ref 0.1–1.0)
MONOS PCT: 5 %
NEUTROS ABS: 10.1 10*3/uL — AB (ref 1.7–7.7)
NEUTROS PCT: 81 %
PLATELETS: 294 10*3/uL (ref 150–400)
RBC: 4.26 MIL/uL (ref 3.87–5.11)
RDW: 12.1 % (ref 11.5–15.5)
WBC: 12.5 10*3/uL — ABNORMAL HIGH (ref 4.0–10.5)

## 2016-10-29 LAB — COMPREHENSIVE METABOLIC PANEL
ALT: 21 U/L (ref 14–54)
ANION GAP: 14 (ref 5–15)
AST: 27 U/L (ref 15–41)
Albumin: 3.9 g/dL (ref 3.5–5.0)
Alkaline Phosphatase: 80 U/L (ref 38–126)
BILIRUBIN TOTAL: 0.5 mg/dL (ref 0.3–1.2)
BUN: 7 mg/dL (ref 6–20)
CO2: 20 mmol/L — ABNORMAL LOW (ref 22–32)
Calcium: 9.7 mg/dL (ref 8.9–10.3)
Chloride: 109 mmol/L (ref 101–111)
Creatinine, Ser: 1.27 mg/dL — ABNORMAL HIGH (ref 0.44–1.00)
GFR calc Af Amer: >60 mL/min (ref >60)
GFR, EST NON AFRICAN AMERICAN: 54 mL/min — AB (ref >60)
Glucose, Bld: 120 mg/dL — ABNORMAL HIGH (ref 65–99)
Potassium: 3.7 mmol/L (ref 3.5–5.1)
Sodium: 143 mmol/L (ref 135–145)
TOTAL PROTEIN: 7.4 g/dL (ref 6.5–8.1)

## 2016-10-29 MED ORDER — DIPHENHYDRAMINE HCL 50 MG/ML IJ SOLN
25.0000 mg | Freq: Once | INTRAMUSCULAR | Status: AC
Start: 2016-10-29 — End: 2016-10-29
  Administered 2016-10-29: 25 mg via INTRAVENOUS
  Filled 2016-10-29: qty 1

## 2016-10-29 MED ORDER — METOCLOPRAMIDE HCL 5 MG/ML IJ SOLN
10.0000 mg | Freq: Once | INTRAMUSCULAR | Status: AC
  Administered 2016-10-29: 10 mg via INTRAVENOUS
  Filled 2016-10-29: qty 2

## 2016-10-29 MED ORDER — ONDANSETRON HCL 4 MG/2ML IJ SOLN
4.0000 mg | Freq: Once | INTRAMUSCULAR | Status: AC
  Administered 2016-10-29: 4 mg via INTRAVENOUS
  Filled 2016-10-29: qty 2

## 2016-10-29 MED ORDER — DEXAMETHASONE SODIUM PHOSPHATE 10 MG/ML IJ SOLN
10.0000 mg | Freq: Once | INTRAMUSCULAR | Status: AC
  Administered 2016-10-29: 10 mg via INTRAVENOUS
  Filled 2016-10-29: qty 1

## 2016-10-29 MED ORDER — SODIUM CHLORIDE 0.9 % IV BOLUS (SEPSIS)
1000.0000 mL | Freq: Once | INTRAVENOUS | Status: AC
  Administered 2016-10-29: 1000 mL via INTRAVENOUS

## 2016-10-29 MED ORDER — ACETAMINOPHEN 500 MG PO TABS
1000.0000 mg | ORAL_TABLET | Freq: Once | ORAL | Status: AC
Start: 2016-10-29 — End: 2016-10-30
  Administered 2016-10-30: 1000 mg via ORAL
  Filled 2016-10-29: qty 2

## 2016-10-29 MED ORDER — KETOROLAC TROMETHAMINE 15 MG/ML IJ SOLN
15.0000 mg | Freq: Once | INTRAMUSCULAR | Status: AC
  Administered 2016-10-30: 15 mg via INTRAVENOUS
  Filled 2016-10-29: qty 1

## 2016-10-29 NOTE — ED Notes (Addendum)
1,000 bolus of NS hung, Dr. Eudelia Bunch aware of pt HR

## 2016-10-29 NOTE — ED Notes (Signed)
Pt unable to provide urine sample at this time. Given urine cup with cleansing wipe to try while waiting for a room.

## 2016-10-29 NOTE — ED Triage Notes (Signed)
Pt c/o nausea vomiting and diarrhea x's 2 weeks.  Also c/o migraine headache and sore throat.  St's daughter was recently dx with the flu

## 2016-10-29 NOTE — ED Notes (Signed)
ED Provider at bedside. 

## 2016-10-29 NOTE — ED Provider Notes (Signed)
[facility]-EMERGENCY DEPT Provider Note   CSN: [id number] Arrival date & time: 10/29/16  1753     History   Chief Complaint Chief Complaint  Patient presents with  . Emesis    HPI Mary Dean is a 36 y.o. female.  The history is provided by the patient.  GI Problem  This is a new problem. Episode onset: 2 weeks. The problem occurs daily. Progression since onset: emesis is worsening. diarrhea resolved as of 2 days ago. Associated symptoms include headaches (migrain; typical for patient). Pertinent negatives include no chest pain, no abdominal pain and no shortness of breath. Exacerbated by: eating and drinking. Nothing relieves the symptoms.   Also complains of hemorrhoid pain since she started having diarrhea.  Reports that daughter was +for influenza with similar symptoms.   Past Medical History:  Diagnosis Date  . Abnormal Pap smear   . Hypertension   . Migraines   . Raynaud's disease     There are no active problems to display for this patient.   Past Surgical History:  Procedure Laterality Date  . IVF    . WISDOM TOOTH EXTRACTION      OB History    Gravida Para Term Preterm AB Living   2 2 2  0 0 2   SAB TAB Ectopic Multiple Live Births   0 0 0 0 1       Home Medications    Prior to Admission medications   Medication Sig Start Date End Date Taking? Authorizing Provider  ALPRAZolam ) 0.5 MG tablet Take 0.5 mg by mouth 2 (two) times daily as needed for anxiety. 10/21/16  Yes Historical Provider, MD  baclofen (LIORESAL) 20 MG tablet Take 20 mg by mouth 3 (three) times daily as needed for spasms. 10/21/16  Yes Historical Provider, MD  cloNIDine (CATAPRES) 0.1 MG tablet Take 0.1 mg by mouth daily as needed. Hypertension as needed 10/21/16  Yes Historical Provider, MD  dicyclomine (BENTYL) 20 MG tablet Take 20 mg by mouth 4 (four) times daily as needed for cramping. 10/21/16  Yes Historical Provider, MD  diphenoxylate-atropine (LOMOTIL) 2.5-0.025 MG tablet  Take 1 tablet by mouth 2 (two) times daily as needed for diarrhea or loose stools. 10/21/16  Yes Historical Provider, MD  escitalopram (LEXAPRO) 10 MG tablet Take 10 mg by mouth daily. 10/21/16  Yes Historical Provider, MD  ketorolac (TORADOL) 10 MG tablet Take 10 mg by mouth daily as needed for migraine. 09/03/16  Yes Historical Provider, MD  lisinopril-hydrochlorothiazide (PRINZIDE,ZESTORETIC) 20-25 MG tablet Take 30 tablets by mouth daily. 10/21/16  Yes Historical Provider, MD  oxyCODONE-acetaminophen (PERCOCET/ROXICET) 5-325 MG tablet Take 1 tablet by mouth every 6 (six) hours as needed for pain. 10/21/16  Yes Historical Provider, MD  promethazine (PHENERGAN) 25 MG tablet Take 25 mg by mouth daily as needed for nausea/vomiting. 10/21/16  Yes Historical Provider, MD  tiZANidine (ZANAFLEX) 4 MG tablet Take 4 mg by mouth 2 (two) times daily as needed for spasms. 10/15/16  Yes Historical Provider, MD  VIBERZI 100 MG TABS Take 100 mg by mouth 2 (two) times daily as needed. FOR IBS 09/23/16  Yes Historical Provider, MD  zolpidem (AMBIEN) 10 MG tablet Take 10 mg by mouth at bedtime. 10/15/16  Yes Historical Provider, MD    Family History Family History  Problem Relation Age of Onset  . Diabetes Mother   . Diabetes Maternal Grandmother   . Hypertension Maternal Grandmother   . Kidney disease Maternal Grandmother  Social History Social History  Substance Use Topics  . Smoking status: Never Smoker  . Smokeless tobacco: Never Used  . Alcohol use No     Allergies   Humira [adalimumab]   Review of Systems Review of Systems  Respiratory: Negative for shortness of breath.   Cardiovascular: Negative for chest pain.  Gastrointestinal: Negative for abdominal pain.  Neurological: Positive for headaches (migrain; typical for patient).  Ten systems are reviewed and are negative for acute change except as noted in the HPI    Physical Exam Updated Vital Signs BP 129/90   Pulse (!) 126   Temp  99.1 F (37.3 C) (Oral)   Resp 20   Ht 5\' 2"  (1.575 m)   Wt 150 lb (68 kg)   LMP 10/29/2016 (Exact Date)   SpO2 100%   BMI 27.44 kg/m   Physical Exam  Constitutional: She is oriented to person, place, and time. She appears well-developed and well-nourished. No distress.  HENT:  Head: Normocephalic and atraumatic.  Nose: Nose normal.  Eyes: Conjunctivae and EOM are normal. Pupils are equal, round, and reactive to light. Right eye exhibits no discharge. Left eye exhibits no discharge. No scleral icterus.  Neck: Normal range of motion. Neck supple.  Cardiovascular: Regular rhythm.  Tachycardia present.  Exam reveals no gallop and no friction rub.   No murmur heard. Pulmonary/Chest: Effort normal and breath sounds normal. No stridor. No respiratory distress. She has no rales.  Abdominal: Soft. She exhibits no distension. There is no tenderness.  Genitourinary:  Genitourinary Comments: Pt declined perineal exam of hemorrhoids  Musculoskeletal: She exhibits no edema or tenderness.  Neurological: She is alert and oriented to person, place, and time.  Skin: Skin is warm and dry. No rash noted. She is not diaphoretic. No erythema.  Psychiatric: She has a normal mood and affect.  Vitals reviewed.    ED Treatments / Results  Labs (all labs ordered are listed, but only abnormal results are displayed) Labs Reviewed  CBC WITH DIFFERENTIAL/PLATELET - Abnormal; Notable for the following:       Result Value   WBC 12.5 (*)    MCHC 36.5 (*)    Neutro Abs 10.1 (*)    All other components within normal limits  COMPREHENSIVE METABOLIC PANEL - Abnormal; Notable for the following:    CO2 20 (*)    Glucose, Bld 120 (*)    Creatinine, Ser 1.27 (*)    GFR calc non Af Amer 54 (*)    All other components within normal limits  URINALYSIS, ROUTINE W REFLEX MICROSCOPIC    EKG  EKG Interpretation None       Radiology No results found.  Procedures Procedures (including critical care  time)  Medications Ordered in ED Medications  ketorolac (TORADOL) 15 MG/ML injection 15 mg (not administered)  acetaminophen (TYLENOL) tablet 1,000 mg (not administered)  ondansetron (ZOFRAN) injection 4 mg (4 mg Intravenous Given 10/29/16 2103)  metoCLOPramide (REGLAN) injection 10 mg (10 mg Intravenous Given 10/29/16 2201)  diphenhydrAMINE (BENADRYL) injection 25 mg (25 mg Intravenous Given 10/29/16 2200)  dexamethasone (DECADRON) injection 10 mg (10 mg Intravenous Given 10/29/16 2201)  sodium chloride 0.9 % bolus 1,000 mL (1,000 mLs Intravenous New Bag/Given 10/29/16 2254)     Initial Impression / Assessment and Plan / ED Course  I have reviewed the triage vital signs and the nursing notes.  Pertinent labs & imaging results that were available during my care of the patient were reviewed by me and  considered in my medical decision making (see chart for details).     Presentation consistent with likely viral process. Labs without evidence of AKI likely due to dehydration. She also with leukocytosis. Abdomen benign. Doubt serious bacterial process.   She also with typical migraine headache for the pt. Non focal neuro exam. No recent head trauma. No fever. Doubt meningitis. Doubt intracranial bleed. Doubt IIH. No indication for imaging. Will treat with migraine cocktail and reevaluate.  Patient provided with IV fluids. Tachycardia improving. Will require additional IV fluid boluses.  Will by mouth challenge.  Patient care turned over to Dr Blinda Leatherwood at 0000. Patient case and results discussed in detail; please see their note for further ED managment.    Final Clinical Impressions(s) / ED Diagnoses   Final diagnoses:  Nausea vomiting and diarrhea  Hemiplegic migraine without status migrainosus, not intractable  Tachycardia      Nira Conn, MD 10/30/16 0000

## 2016-10-30 MED ORDER — ONDANSETRON HCL 4 MG PO TABS: 4.0000 mg | ORAL_TABLET | Freq: Four times a day (QID) | ORAL | 0 refills | Status: DC

## 2016-10-30 MED ORDER — SODIUM CHLORIDE 0.9 % IV BOLUS (SEPSIS)
1000.0000 mL | Freq: Once | INTRAVENOUS | Status: AC
  Administered 2016-10-30: 1000 mL via INTRAVENOUS

## 2016-10-30 NOTE — ED Provider Notes (Signed)
Patient signed out to me to follow-up on progress. She was seen with acute nausea and vomiting, presumed influenza. Patient's child has been diagnosed with influenza this week. She was administered IV fluids. She is now tolerating oral intake without difficulty. Patient remains tachycardic, but has improved somewhat from initial presentation. She reports that she feels well enough to go home, would prefer discharge to admission, will provide antiemetics and she will return to the ER if she is unable to hold anything down.   Gilda Crease, MD 10/30/16 [phone]

## 2017-07-01 ENCOUNTER — Ambulatory Visit: Payer: Managed Care, Other (non HMO) | Admitting: Neurology

## 2018-01-31 ENCOUNTER — Other Ambulatory Visit: Payer: Self-pay | Admitting: Orthopedic Surgery

## 2018-01-31 DIAGNOSIS — M25571 Pain in right ankle and joints of right foot: Secondary | ICD-10-CM

## 2018-02-04 ENCOUNTER — Ambulatory Visit
Admission: RE | Admit: 2018-02-04 | Discharge: 2018-02-04 | Disposition: A | Discharge status: Home / Self Care | Payer: Managed Care, Other (non HMO) | Source: Ambulatory Visit | Attending: Orthopedic Surgery | Admitting: Orthopedic Surgery

## 2018-02-04 DIAGNOSIS — M25571 Pain in right ankle and joints of right foot: Secondary | ICD-10-CM

## 2018-07-07 ENCOUNTER — Encounter: Payer: Self-pay | Admitting: Neurology
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
## 2018-08-12 DIAGNOSIS — G43119 Migraine with aura, intractable, without status migrainosus: Secondary | ICD-10-CM | POA: Diagnosis not present

## 2018-08-12 DIAGNOSIS — G43111 Migraine with aura, intractable, with status migrainosus: Secondary | ICD-10-CM | POA: Diagnosis not present

## 2018-08-12 DIAGNOSIS — E782 Mixed hyperlipidemia: Secondary | ICD-10-CM | POA: Diagnosis not present

## 2018-08-12 DIAGNOSIS — I10 Essential (primary) hypertension: Secondary | ICD-10-CM | POA: Diagnosis not present

## 2018-08-30 DIAGNOSIS — G43111 Migraine with aura, intractable, with status migrainosus: Secondary | ICD-10-CM | POA: Diagnosis not present

## 2018-08-30 DIAGNOSIS — E782 Mixed hyperlipidemia: Secondary | ICD-10-CM | POA: Diagnosis not present

## 2018-08-30 DIAGNOSIS — E860 Dehydration: Secondary | ICD-10-CM | POA: Diagnosis not present

## 2018-08-30 DIAGNOSIS — G43119 Migraine with aura, intractable, without status migrainosus: Secondary | ICD-10-CM | POA: Diagnosis not present

## 2018-08-30 DIAGNOSIS — I10 Essential (primary) hypertension: Secondary | ICD-10-CM | POA: Diagnosis not present

## 2018-09-12 NOTE — Progress Notes (Signed)
NEUROLOGY CONSULTATION NOTE  Mary Dean MRN: [id number] DOB: [birth year]  Referring provider: Clelia Croftobert George, NP Primary care provider: Clelia Croftobert George, NP  Reason for consult:  Spells.  HISTORY OF PRESENT ILLNESS: Mary Dean is a 38 year old right-handed Caucasian woman with RA and hypertension and history of migraines who presents for spells.  She is accompanied by her husband who supplements history.  Since October 30, she reports episodes of twitching involving the eyes and body.  Nobody sees it but it is something she feels.  She feels like her eyes are fluttering but nobody sees it.  Sometimes her lips and hands become numb.  She reports associated anxiety.  She reports prior episodes of syncope but never passed out with these spells.  She denies associated epigastric rising, phantosmia, headache, visual disturbance or unilateral numbness or weakness.   It lasts a few seconds.  It may occur every few minutes.  It has become more frequent, now occurring 4 out of 7 days a week, several times throughout the day.  It seems to occur more in the evening.  She reports she had an MRI of the brain with and without contrast on 06/27/18 which was personally reviewed and was unremarkable.  Her PCP attributed it to high blood pressure (220/140).  She has been to the hospital for this.  Despite blood pressure control, spells still occur.  Her PCP wants to order an EEG.   PAST MEDICAL HISTORY: Past Medical History:  Diagnosis Date  . Abnormal Pap smear   . Hypertension   . Migraines   . Raynaud's disease     PAST SURGICAL HISTORY: Past Surgical History:  Procedure Laterality Date  . IVF    . WISDOM TOOTH EXTRACTION      MEDICATIONS: Current Outpatient Medications on File Prior to Visit  Medication Sig Dispense Refill  . ALPRAZolam (XANAX) 0.5 MG tablet Take 0.5 mg by mouth 2 (two) times daily as needed for anxiety.    . baclofen (LIORESAL) 20 MG tablet Take 20 mg by mouth 3 (three)  times daily as needed for spasms.    . cloNIDine (CATAPRES) 0.1 MG tablet Take 0.1 mg by mouth daily as needed. Hypertension as needed    . dicyclomine (BENTYL) 20 MG tablet Take 20 mg by mouth 4 (four) times daily as needed for cramping.    . diphenoxylate-atropine (LOMOTIL) 2.5-0.025 MG tablet Take 1 tablet by mouth 2 (two) times daily as needed for diarrhea or loose stools.    Marland Kitchen. escitalopram (LEXAPRO) 10 MG tablet Take 10 mg by mouth daily.    Marland Kitchen. ketorolac (TORADOL) 10 MG tablet Take 10 mg by mouth daily as needed for migraine.    Marland Kitchen. lisinopril-hydrochlorothiazide (PRINZIDE,ZESTORETIC) 20-25 MG tablet Take 30 tablets by mouth daily.    . ondansetron (ZOFRAN) 4 MG tablet Take 1 tablet (4 mg total) by mouth every 6 (six) hours. 12 tablet 0  . oxyCODONE-acetaminophen (PERCOCET/ROXICET) 5-325 MG tablet Take 1 tablet by mouth every 6 (six) hours as needed for pain.    . promethazine (PHENERGAN) 25 MG tablet Take 25 mg by mouth daily as needed for nausea/vomiting.    Marland Kitchen. tiZANidine (ZANAFLEX) 4 MG tablet Take 4 mg by mouth 2 (two) times daily as needed for spasms.    Marland Kitchen. VIBERZI 100 MG TABS Take 100 mg by mouth 2 (two) times daily as needed. FOR IBS    . zolpidem (AMBIEN) 10 MG tablet Take 10 mg by mouth at bedtime.  No current facility-administered medications on file prior to visit.     ALLERGIES: Allergies  Allergen Reactions  . Humira [Adalimumab] Other (See Comments)    Acute renal failure    FAMILY HISTORY: Family History  Problem Relation Age of Onset  . Diabetes Mother   . Diabetes Maternal Grandmother   . Hypertension Maternal Grandmother   . Kidney disease Maternal Grandmother    SOCIAL HISTORY: Social History   Socioeconomic History  . Marital status: Married    Spouse name: Not on file  . Number of children: Not on file  . Years of education: Not on file  . Highest education level: Not on file  Occupational History  . Not on file  Social Needs  . Financial resource  strain: Not on file  . Food insecurity:    Worry: Not on file    Inability: Not on file  . Transportation needs:    Medical: Not on file    Non-medical: Not on file  Tobacco Use  . Smoking status: Never Smoker  . Smokeless tobacco: Never Used  Substance and Sexual Activity  . Alcohol use: No  . Drug use: No  . Sexual activity: Never  Lifestyle  . Physical activity:    Days per week: Not on file    Minutes per session: Not on file  . Stress: Not on file  Relationships  . Social connections:    Talks on phone: Not on file    Gets together: Not on file    Attends religious service: Not on file    Active member of club or organization: Not on file    Attends meetings of clubs or organizations: Not on file    Relationship status: Not on file  . Intimate partner violence:    Fear of current or ex partner: Not on file    Emotionally abused: Not on file    Physically abused: Not on file    Forced sexual activity: Not on file  Other Topics Concern  . Not on file  Social History Narrative  . Not on file    REVIEW OF SYSTEMS: Constitutional: No fevers, chills, or sweats, no generalized fatigue, change in appetite Eyes: No visual changes, double vision, eye pain Ear, nose and throat: No hearing loss, ear pain, nasal congestion, sore throat Cardiovascular: No chest pain, palpitations Respiratory:  No shortness of breath at rest or with exertion, wheezes GastrointestinaI: No nausea, vomiting, diarrhea, abdominal pain, fecal incontinence Genitourinary:  No dysuria, urinary retention or frequency Musculoskeletal:  No neck pain, back pain Integumentary: No rash, pruritus, skin lesions Neurological: as above Psychiatric: No depression, insomnia, anxiety Endocrine: No palpitations, fatigue, diaphoresis, mood swings, change in appetite, change in weight, increased thirst Hematologic/Lymphatic:  No purpura, petechiae. Allergic/Immunologic: no itchy/runny eyes, nasal congestion, recent  allergic reactions, rashes  PHYSICAL EXAM: Blood pressure 96/68, pulse 88, height 5\' 2"  (1.575 m), weight 170 lb (77.1 kg), SpO2 99 %, unknown if currently breastfeeding. General: No acute distress.  Patient appears well-groomed.   Head:  Normocephalic/atraumatic Eyes:  fundi examined but not visualized Neck: supple, no paraspinal tenderness, full range of motion Back: No paraspinal tenderness Heart: regular rate and rhythm Lungs: Clear to auscultation bilaterally. Vascular: No carotid bruits. Neurological Exam: Mental status: alert and oriented to person, place, and time, recent and remote memory intact, fund of knowledge intact, attention and concentration intact, speech fluent and not dysarthric, language intact. Cranial nerves: CN I: not tested CN II: pupils equal, round  and reactive to light, visual fields intact CN III, IV, VI:  full range of motion, no nystagmus, no ptosis CN V: facial sensation intact CN VII: upper and lower face symmetric CN VIII: hearing intact CN IX, X: gag intact, uvula midline CN XI: sternocleidomastoid and trapezius muscles intact CN XII: tongue midline Bulk & Tone: normal, no fasciculations. Motor:  5/5 throughout Sensation:  temperature and vibration sensation intact. Deep Tendon Reflexes:  2+ throughout, toes downgoing.   Finger to nose testing:  Without dysmetria.   Heel to shin:  Without dysmetria.   Gait:  Normal station and stride.  Romberg negative.  IMPRESSION: Recurrent transient spells of subjective tremors.  Semiology not classic for any particular neurologic condition.  If it is anything neurologic, it would most likely be seizure. However, I am more inclined to believe it is related to anxiety.  PLAN: 1.  Check routine EEG 2.  If routine EEG unremarkable, then will order ambulatory EEG 3.  Further recommendations pending results.  Thank you for allowing me to take part in the care of this patient.  Shon Millet, DO  CC: Clelia Croft, NP

## 2018-09-13 ENCOUNTER — Encounter

## 2018-09-13 ENCOUNTER — Ambulatory Visit (INDEPENDENT_AMBULATORY_CARE_PROVIDER_SITE_OTHER): Payer: BLUE CROSS/BLUE SHIELD | Admitting: Neurology

## 2018-09-13 ENCOUNTER — Encounter: Payer: Self-pay | Admitting: Neurology

## 2018-09-13 VITALS — BP 96/68 | HR 88 | Ht 62.0 in | Wt 170.0 lb

## 2018-09-13 DIAGNOSIS — R251 Tremor, unspecified: Secondary | ICD-10-CM | POA: Diagnosis not present

## 2018-09-13 NOTE — Patient Instructions (Signed)
1.  We will schedule routine EEG.  If unremarkable, then we will order an ambulatory EEG

## 2018-09-14 ENCOUNTER — Other Ambulatory Visit: Payer: 59

## 2018-09-19 ENCOUNTER — Ambulatory Visit (INDEPENDENT_AMBULATORY_CARE_PROVIDER_SITE_OTHER): Payer: BLUE CROSS/BLUE SHIELD | Admitting: Neurology

## 2018-09-19 DIAGNOSIS — R251 Tremor, unspecified: Secondary | ICD-10-CM | POA: Diagnosis not present

## 2018-09-19 NOTE — Procedures (Signed)
ELECTROENCEPHALOGRAM REPORT  Date of Study: 09/19/2018  Patient's Name: Mary Dean MRN: [id number] Date of Birth: 05-10-1981  Referring Provider: Shon Millet, DO  Clinical History: 38 year old woman with twitching involving eyes and body.  Medications: XANAX 0.5 MG tablet   LIORESAL 20 MG tablet  CATAPRES 0.1 MG tablet   BENTYL 20 MG tablet  LOMOTIL 2.5-0.025 MG tablet  LEXAPRO 10 MG tablet  TORADOL 10 MG tablet  PRINZIDE,ZESTORETIC 20-25 MG tablet  ZOFRAN 4 MG tablet  PERCOCET/ROXICET 5-325 MG tablet  PHENERGAN 25 MG tablet  ZANAFLEX 4 MG tablet  VIBERZI 100 MG TABS  AMBIEN 10 MG tablet  Technical Summary: A multichannel digital EEG recording measured by the international 10-20 system with electrodes applied with paste and impedances below 5000 ohms performed in our laboratory with EKG monitoring in an awake and asleep patient.  Hyperventilation and photic stimulation were performed.  The digital EEG was referentially recorded, reformatted, and digitally filtered in a variety of bipolar and referential montages for optimal display.    Description: The patient is awake and asleep during the recording.  During maximal wakefulness, there is a symmetric, medium voltage 10 Hz posterior dominant rhythm that attenuates with eye opening.  The record is symmetric.  During drowsiness and sleep, there is an increase in theta slowing of the background.  Vertex waves and symmetric sleep spindles were seen.  Hyperventilation and photic stimulation did not elicit any abnormalities.  There were no epileptiform discharges or electrographic seizures seen.    EKG lead was unremarkable.  Impression: This awake and asleep EEG is normal.    Clinical Correlation: A normal EEG does not exclude a clinical diagnosis of epilepsy.  If further clinical questions remain, prolonged EEG may be helpful.  Clinical correlation is advised.   Shon Millet, DO

## 2018-09-26 ENCOUNTER — Telehealth: Payer: Self-pay | Admitting: Neurology

## 2018-09-26 NOTE — Telephone Encounter (Signed)
Patient is needing recent EEG results. Please call her back at [phone]. Thanks!

## 2018-09-27 NOTE — Telephone Encounter (Signed)
Called and LMOVM for Pt to return my call concerning EEG results

## 2018-09-28 ENCOUNTER — Telehealth: Payer: Self-pay

## 2018-09-28 NOTE — Telephone Encounter (Signed)
-----   Message from Drema Dallas, DO sent at 09/20/2018  8:06 AM EST ----- I reviewed the EEG.  The tech reported that she had a couple of body jerks but I want to verify if she had one of her habitual events during the EEG.  The EEG is normal.  If she didn't have one of her habitual events, then I would like to proceed with a 72 hour ambulatory EEG.  If she did have her habitual events, then it does not appear to be seizure and unlikely neurologic.  Therefore, no further workup would be indicated.

## 2018-09-28 NOTE — Telephone Encounter (Signed)
Called and spoke with Pt. She has an ambulatory scheduled. She did not have an episode while having the EEG. The jerking was her RLS.

## 2018-10-03 ENCOUNTER — Other Ambulatory Visit: Payer: 59

## 2018-10-06 DIAGNOSIS — R062 Wheezing: Secondary | ICD-10-CM | POA: Diagnosis not present

## 2018-10-06 DIAGNOSIS — R531 Weakness: Secondary | ICD-10-CM | POA: Diagnosis not present

## 2018-10-06 DIAGNOSIS — E860 Dehydration: Secondary | ICD-10-CM | POA: Diagnosis not present

## 2018-10-06 DIAGNOSIS — R05 Cough: Secondary | ICD-10-CM | POA: Diagnosis not present

## 2018-10-06 DIAGNOSIS — R0602 Shortness of breath: Secondary | ICD-10-CM | POA: Diagnosis not present

## 2018-10-06 DIAGNOSIS — M069 Rheumatoid arthritis, unspecified: Secondary | ICD-10-CM | POA: Diagnosis not present

## 2018-10-06 DIAGNOSIS — R42 Dizziness and giddiness: Secondary | ICD-10-CM | POA: Diagnosis not present

## 2018-10-06 DIAGNOSIS — F17210 Nicotine dependence, cigarettes, uncomplicated: Secondary | ICD-10-CM | POA: Diagnosis not present

## 2018-10-06 DIAGNOSIS — B349 Viral infection, unspecified: Secondary | ICD-10-CM | POA: Diagnosis not present

## 2018-10-06 DIAGNOSIS — I10 Essential (primary) hypertension: Secondary | ICD-10-CM | POA: Diagnosis not present

## 2018-10-06 DIAGNOSIS — R0600 Dyspnea, unspecified: Secondary | ICD-10-CM | POA: Diagnosis not present

## 2018-10-13 DIAGNOSIS — E782 Mixed hyperlipidemia: Secondary | ICD-10-CM | POA: Diagnosis not present

## 2018-10-13 DIAGNOSIS — R6883 Chills (without fever): Secondary | ICD-10-CM | POA: Diagnosis not present

## 2018-10-13 DIAGNOSIS — G43111 Migraine with aura, intractable, with status migrainosus: Secondary | ICD-10-CM | POA: Diagnosis not present

## 2018-10-13 DIAGNOSIS — I10 Essential (primary) hypertension: Secondary | ICD-10-CM | POA: Diagnosis not present

## 2018-10-26 DIAGNOSIS — M1712 Unilateral primary osteoarthritis, left knee: Secondary | ICD-10-CM | POA: Diagnosis not present

## 2018-10-26 DIAGNOSIS — E782 Mixed hyperlipidemia: Secondary | ICD-10-CM | POA: Diagnosis not present

## 2018-10-26 DIAGNOSIS — I10 Essential (primary) hypertension: Secondary | ICD-10-CM | POA: Diagnosis not present

## 2018-10-26 DIAGNOSIS — G43119 Migraine with aura, intractable, without status migrainosus: Secondary | ICD-10-CM | POA: Diagnosis not present

## 2018-10-26 DIAGNOSIS — G43111 Migraine with aura, intractable, with status migrainosus: Secondary | ICD-10-CM | POA: Diagnosis not present

## 2018-11-07 DIAGNOSIS — G43119 Migraine with aura, intractable, without status migrainosus: Secondary | ICD-10-CM | POA: Diagnosis not present

## 2018-11-07 DIAGNOSIS — E860 Dehydration: Secondary | ICD-10-CM | POA: Diagnosis not present

## 2018-11-07 DIAGNOSIS — R110 Nausea: Secondary | ICD-10-CM | POA: Diagnosis not present

## 2018-11-07 DIAGNOSIS — R000 Tachycardia, unspecified: Secondary | ICD-10-CM | POA: Diagnosis not present

## 2018-11-07 DIAGNOSIS — I10 Essential (primary) hypertension: Secondary | ICD-10-CM | POA: Diagnosis not present

## 2018-11-07 DIAGNOSIS — E782 Mixed hyperlipidemia: Secondary | ICD-10-CM | POA: Diagnosis not present

## 2018-11-07 DIAGNOSIS — G43111 Migraine with aura, intractable, with status migrainosus: Secondary | ICD-10-CM | POA: Diagnosis not present

## 2018-12-09 DIAGNOSIS — R05 Cough: Secondary | ICD-10-CM | POA: Diagnosis not present

## 2018-12-09 DIAGNOSIS — R0602 Shortness of breath: Secondary | ICD-10-CM | POA: Diagnosis not present

## 2019-01-12 DIAGNOSIS — I160 Hypertensive urgency: Secondary | ICD-10-CM | POA: Diagnosis not present

## 2019-01-12 DIAGNOSIS — I10 Essential (primary) hypertension: Secondary | ICD-10-CM | POA: Diagnosis not present

## 2019-01-12 DIAGNOSIS — D72829 Elevated white blood cell count, unspecified: Secondary | ICD-10-CM | POA: Diagnosis not present

## 2019-01-12 DIAGNOSIS — G43909 Migraine, unspecified, not intractable, without status migrainosus: Secondary | ICD-10-CM | POA: Diagnosis not present

## 2019-01-12 DIAGNOSIS — R079 Chest pain, unspecified: Secondary | ICD-10-CM | POA: Diagnosis not present

## 2019-01-12 DIAGNOSIS — I7300 Raynaud's syndrome without gangrene: Secondary | ICD-10-CM | POA: Diagnosis not present

## 2019-01-12 DIAGNOSIS — S30861A Insect bite (nonvenomous) of abdominal wall, initial encounter: Secondary | ICD-10-CM | POA: Diagnosis not present

## 2019-01-12 DIAGNOSIS — R0789 Other chest pain: Secondary | ICD-10-CM | POA: Diagnosis not present

## 2019-01-12 DIAGNOSIS — R000 Tachycardia, unspecified: Secondary | ICD-10-CM | POA: Diagnosis not present

## 2019-01-12 DIAGNOSIS — Z79899 Other long term (current) drug therapy: Secondary | ICD-10-CM | POA: Diagnosis not present

## 2019-01-12 DIAGNOSIS — W57XXXA Bitten or stung by nonvenomous insect and other nonvenomous arthropods, initial encounter: Secondary | ICD-10-CM | POA: Diagnosis not present

## 2019-01-12 DIAGNOSIS — M069 Rheumatoid arthritis, unspecified: Secondary | ICD-10-CM | POA: Diagnosis not present

## 2019-01-12 DIAGNOSIS — R791 Abnormal coagulation profile: Secondary | ICD-10-CM | POA: Diagnosis not present

## 2019-01-12 DIAGNOSIS — F17210 Nicotine dependence, cigarettes, uncomplicated: Secondary | ICD-10-CM | POA: Diagnosis not present

## 2019-01-13 DIAGNOSIS — S30861A Insect bite (nonvenomous) of abdominal wall, initial encounter: Secondary | ICD-10-CM | POA: Diagnosis not present

## 2019-01-13 DIAGNOSIS — R079 Chest pain, unspecified: Secondary | ICD-10-CM | POA: Diagnosis not present

## 2019-01-13 DIAGNOSIS — R000 Tachycardia, unspecified: Secondary | ICD-10-CM | POA: Diagnosis not present

## 2019-01-13 DIAGNOSIS — G43909 Migraine, unspecified, not intractable, without status migrainosus: Secondary | ICD-10-CM

## 2019-01-13 DIAGNOSIS — D72829 Elevated white blood cell count, unspecified: Secondary | ICD-10-CM

## 2019-01-13 DIAGNOSIS — I160 Hypertensive urgency: Secondary | ICD-10-CM | POA: Diagnosis not present

## 2019-01-17 DIAGNOSIS — G43909 Migraine, unspecified, not intractable, without status migrainosus: Secondary | ICD-10-CM | POA: Diagnosis not present

## 2019-01-17 DIAGNOSIS — A770 Spotted fever due to Rickettsia rickettsii: Secondary | ICD-10-CM | POA: Insufficient documentation

## 2019-01-17 DIAGNOSIS — G8929 Other chronic pain: Secondary | ICD-10-CM | POA: Diagnosis not present

## 2019-01-17 DIAGNOSIS — F17200 Nicotine dependence, unspecified, uncomplicated: Secondary | ICD-10-CM | POA: Diagnosis not present

## 2019-01-17 DIAGNOSIS — Z79899 Other long term (current) drug therapy: Secondary | ICD-10-CM | POA: Diagnosis not present

## 2019-01-17 DIAGNOSIS — R51 Headache: Secondary | ICD-10-CM | POA: Diagnosis not present

## 2019-01-17 DIAGNOSIS — N179 Acute kidney failure, unspecified: Secondary | ICD-10-CM | POA: Diagnosis not present

## 2019-01-17 DIAGNOSIS — R0789 Other chest pain: Secondary | ICD-10-CM | POA: Diagnosis not present

## 2019-01-17 DIAGNOSIS — E860 Dehydration: Secondary | ICD-10-CM | POA: Diagnosis not present

## 2019-01-17 DIAGNOSIS — I4581 Long QT syndrome: Secondary | ICD-10-CM | POA: Diagnosis not present

## 2019-01-17 DIAGNOSIS — R000 Tachycardia, unspecified: Secondary | ICD-10-CM | POA: Diagnosis not present

## 2019-01-17 DIAGNOSIS — R112 Nausea with vomiting, unspecified: Secondary | ICD-10-CM | POA: Diagnosis not present

## 2019-01-17 DIAGNOSIS — W57XXXA Bitten or stung by nonvenomous insect and other nonvenomous arthropods, initial encounter: Secondary | ICD-10-CM | POA: Diagnosis not present

## 2019-01-17 DIAGNOSIS — R197 Diarrhea, unspecified: Secondary | ICD-10-CM | POA: Diagnosis not present

## 2019-01-17 DIAGNOSIS — E871 Hypo-osmolality and hyponatremia: Secondary | ICD-10-CM | POA: Diagnosis not present

## 2019-01-17 DIAGNOSIS — Z885 Allergy status to narcotic agent status: Secondary | ICD-10-CM | POA: Diagnosis not present

## 2019-01-17 DIAGNOSIS — F411 Generalized anxiety disorder: Secondary | ICD-10-CM | POA: Diagnosis not present

## 2019-01-17 DIAGNOSIS — I10 Essential (primary) hypertension: Secondary | ICD-10-CM | POA: Diagnosis not present

## 2019-01-17 DIAGNOSIS — R079 Chest pain, unspecified: Secondary | ICD-10-CM | POA: Diagnosis not present

## 2019-01-17 DIAGNOSIS — Z888 Allergy status to other drugs, medicaments and biological substances status: Secondary | ICD-10-CM | POA: Diagnosis not present

## 2019-01-17 HISTORY — DX: Spotted fever due to Rickettsia rickettsii: A77.0

## 2019-01-23 DIAGNOSIS — N179 Acute kidney failure, unspecified: Secondary | ICD-10-CM | POA: Diagnosis not present

## 2019-01-25 DIAGNOSIS — I10 Essential (primary) hypertension: Secondary | ICD-10-CM | POA: Diagnosis not present

## 2019-01-25 DIAGNOSIS — G43111 Migraine with aura, intractable, with status migrainosus: Secondary | ICD-10-CM | POA: Diagnosis not present

## 2019-01-25 DIAGNOSIS — E782 Mixed hyperlipidemia: Secondary | ICD-10-CM | POA: Diagnosis not present

## 2019-01-25 DIAGNOSIS — M069 Rheumatoid arthritis, unspecified: Secondary | ICD-10-CM | POA: Diagnosis not present

## 2019-02-01 DIAGNOSIS — G43111 Migraine with aura, intractable, with status migrainosus: Secondary | ICD-10-CM | POA: Diagnosis not present

## 2019-02-01 DIAGNOSIS — J029 Acute pharyngitis, unspecified: Secondary | ICD-10-CM | POA: Diagnosis not present

## 2019-02-01 DIAGNOSIS — R0989 Other specified symptoms and signs involving the circulatory and respiratory systems: Secondary | ICD-10-CM | POA: Diagnosis not present

## 2019-02-01 DIAGNOSIS — I10 Essential (primary) hypertension: Secondary | ICD-10-CM | POA: Diagnosis not present

## 2019-02-01 DIAGNOSIS — E782 Mixed hyperlipidemia: Secondary | ICD-10-CM | POA: Diagnosis not present

## 2019-02-02 DIAGNOSIS — M25571 Pain in right ankle and joints of right foot: Secondary | ICD-10-CM | POA: Diagnosis not present

## 2019-02-02 DIAGNOSIS — M79671 Pain in right foot: Secondary | ICD-10-CM | POA: Diagnosis not present

## 2019-02-02 DIAGNOSIS — S92121K Displaced fracture of body of right talus, subsequent encounter for fracture with nonunion: Secondary | ICD-10-CM | POA: Diagnosis not present

## 2019-02-03 DIAGNOSIS — D492 Neoplasm of unspecified behavior of bone, soft tissue, and skin: Secondary | ICD-10-CM | POA: Diagnosis not present

## 2019-02-03 DIAGNOSIS — R590 Localized enlarged lymph nodes: Secondary | ICD-10-CM | POA: Diagnosis not present

## 2019-02-04 DIAGNOSIS — R509 Fever, unspecified: Secondary | ICD-10-CM | POA: Diagnosis not present

## 2019-02-04 DIAGNOSIS — R000 Tachycardia, unspecified: Secondary | ICD-10-CM | POA: Diagnosis not present

## 2019-02-04 DIAGNOSIS — S90421A Blister (nonthermal), right great toe, initial encounter: Secondary | ICD-10-CM | POA: Diagnosis not present

## 2019-02-04 DIAGNOSIS — G43109 Migraine with aura, not intractable, without status migrainosus: Secondary | ICD-10-CM | POA: Diagnosis not present

## 2019-02-04 DIAGNOSIS — Z20828 Contact with and (suspected) exposure to other viral communicable diseases: Secondary | ICD-10-CM | POA: Diagnosis not present

## 2019-02-04 DIAGNOSIS — R079 Chest pain, unspecified: Secondary | ICD-10-CM | POA: Diagnosis not present

## 2019-02-04 DIAGNOSIS — R0600 Dyspnea, unspecified: Secondary | ICD-10-CM | POA: Diagnosis not present

## 2019-02-06 DIAGNOSIS — R7989 Other specified abnormal findings of blood chemistry: Secondary | ICD-10-CM | POA: Diagnosis not present

## 2019-02-06 DIAGNOSIS — Z01812 Encounter for preprocedural laboratory examination: Secondary | ICD-10-CM | POA: Diagnosis not present

## 2019-02-06 DIAGNOSIS — A6920 Lyme disease, unspecified: Secondary | ICD-10-CM | POA: Diagnosis not present

## 2019-02-06 DIAGNOSIS — A779 Spotted fever, unspecified: Secondary | ICD-10-CM | POA: Diagnosis not present

## 2019-02-10 ENCOUNTER — Other Ambulatory Visit: Payer: Self-pay | Admitting: Student

## 2019-02-10 DIAGNOSIS — S92121K Displaced fracture of body of right talus, subsequent encounter for fracture with nonunion: Secondary | ICD-10-CM

## 2019-03-10 DIAGNOSIS — M069 Rheumatoid arthritis, unspecified: Secondary | ICD-10-CM | POA: Diagnosis not present

## 2019-03-10 DIAGNOSIS — I10 Essential (primary) hypertension: Secondary | ICD-10-CM | POA: Diagnosis not present

## 2019-03-10 DIAGNOSIS — G43111 Migraine with aura, intractable, with status migrainosus: Secondary | ICD-10-CM | POA: Diagnosis not present

## 2019-03-10 DIAGNOSIS — E782 Mixed hyperlipidemia: Secondary | ICD-10-CM | POA: Diagnosis not present

## 2019-03-28 ENCOUNTER — Ambulatory Visit: Payer: BC Managed Care – PPO | Admitting: Cardiology

## 2019-03-31 ENCOUNTER — Other Ambulatory Visit: Payer: Self-pay

## 2019-03-31 ENCOUNTER — Encounter: Payer: Self-pay | Admitting: Cardiology

## 2019-03-31 ENCOUNTER — Ambulatory Visit (INDEPENDENT_AMBULATORY_CARE_PROVIDER_SITE_OTHER): Payer: BC Managed Care – PPO | Admitting: Cardiology

## 2019-03-31 DIAGNOSIS — I10 Essential (primary) hypertension: Secondary | ICD-10-CM

## 2019-03-31 DIAGNOSIS — M069 Rheumatoid arthritis, unspecified: Secondary | ICD-10-CM

## 2019-03-31 DIAGNOSIS — G4700 Insomnia, unspecified: Secondary | ICD-10-CM

## 2019-03-31 DIAGNOSIS — F5101 Primary insomnia: Secondary | ICD-10-CM | POA: Diagnosis not present

## 2019-03-31 DIAGNOSIS — R0609 Other forms of dyspnea: Secondary | ICD-10-CM

## 2019-03-31 DIAGNOSIS — R002 Palpitations: Secondary | ICD-10-CM

## 2019-03-31 DIAGNOSIS — R0600 Dyspnea, unspecified: Secondary | ICD-10-CM

## 2019-03-31 HISTORY — DX: Insomnia, unspecified: G47.00

## 2019-03-31 HISTORY — DX: Rheumatoid arthritis, unspecified: M06.9

## 2019-03-31 HISTORY — DX: Dyspnea, unspecified: R06.00

## 2019-03-31 HISTORY — DX: Other forms of dyspnea: R06.09

## 2019-03-31 HISTORY — DX: Essential (primary) hypertension: I10

## 2019-03-31 HISTORY — DX: Palpitations: R00.2

## 2019-03-31 NOTE — Addendum Note (Signed)
Addended by: Ashok Norris on: 03/31/2019 02:58 PM   Modules accepted: Orders

## 2019-03-31 NOTE — Patient Instructions (Addendum)
Medication Instructions:  Your physician recommends that you continue on your current medications as directed. Please refer to the Current Medication list given to you today.  If you need a refill on your cardiac medications before your next appointment, please call your pharmacy.   Lab work: None.  If you have labs (blood work) drawn today and your tests are completely normal, you will receive your results only by: Marland Kitchen MyChart Message (if you have MyChart) OR . A paper copy in the mail If you have any lab test that is abnormal or we need to change your treatment, we will call you to review the results.  Testing/Procedures: Your physician has recommended that you wear a holter monitor. Holter monitors are medical devices that record the heart's electrical activity. Doctors most often use these monitors to diagnose arrhythmias. Arrhythmias are problems with the speed or rhythm of the heartbeat. The monitor is a small, portable device. You can wear one while you do your normal daily activities. This is usually used to diagnose what is causing palpitations/syncope (passing out). Wear for 7 days.    Your physician has requested that you have a renal artery duplex. During this test, an ultrasound is used to evaluate blood flow to the kidneys. Allow one hour for this exam. Do not eat after midnight the day before and avoid carbonated beverages. Take your medications as you usually do.   Follow-Up: At [facility], you and your health needs are our priority.  As part of our continuing mission to provide you with exceptional heart care, we have created designated Provider Care Teams.  These Care Teams include your primary Cardiologist (physician) and Advanced Practice Providers (APPs -  Physician Assistants and Nurse Practitioners) who all work together to provide you with the care you need, when you need it. You will need a follow up appointment in 1 months.  Please call our office 2 months in advance  to schedule this appointment.  You may see No primary care provider on file. or another member of our [employer] Provider Team in : Shirlee More, MD . Jyl Heinz, MD  Any Other Special Instructions Will Be Listed Below (If Applicable).

## 2019-03-31 NOTE — Progress Notes (Signed)
Cardiology Consultation:    Date:  03/31/2019   ID:  Mary Dean, DOB Apr 12, 1981, MRN [id number]  PCP:  Hortencia Conradi, NP  Cardiologist:  Gypsy Balsam, MD   Referring MD: Hortencia Conradi, NP   Chief Complaint  Patient presents with  . Tachycardia  . Chest Pain  . Hospitalization Follow-up    Seen in June at [facility]    History of Present Illness:    Mary Dean is a 38 y.o. female who is being seen today for the evaluation of heart palpitations at the request of Hortencia Conradi, NP.  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  She does have a lot of issues she complained of having palpitations she is aware of her heart speeding up I did have she check her heart rate will be 120.  She feels palpitations but no irregularities.  She also got some fatigue tiredness.  She does have rheumatoid arthritis she does not take any medications for it right now.  She used to take some intravenous medication but that gave her acute renal failure and she is scared of taking it now.  Again she take care of 2 of her children day 34 is 20 years old second 22 years old.  She is very busy taking care of them.  She does have a chronic insomnia.  She usually goes to bed at 9:00 but she is able to fall asleep may be 2 at 3:00 in the morning.  She gets up 7-8.  She also got history of anxiety.  She is seeing psychotherapist.  And she already started feeling better from that aspect.  She is worried about a lot of things.  Past Medical History:  Diagnosis Date  . Abnormal Pap smear   . Hypertension   . Migraines   . Raynaud's disease     Past Surgical History:  Procedure Laterality Date  . ANKLE SURGERY Right   . BREAST ENHANCEMENT SURGERY    . IVF    . WISDOM TOOTH EXTRACTION      Current Medications: Current Meds  Medication Sig  . ALPRAZolam (XANAX) 1 MG tablet Take 1 mg by mouth.  . cloNIDine (CATAPRES) 0.1 MG tablet Take 0.1 mg by  mouth daily as needed. Hypertension as needed  . Erenumab-aooe (AIMOVIG, 140 MG DOSE,) 70 MG/ML SOAJ Inject 140 mg into the skin every 30 (thirty) days.  Marland Kitchen oxyCODONE-acetaminophen (PERCOCET/ROXICET) 5-325 MG tablet Take 1 tablet by mouth every 6 (six) hours as needed for pain.  Marland Kitchen propranolol (INDERAL) 40 MG tablet Take 1 tablet by mouth 2 (two) times daily.  Marland Kitchen tiZANidine (ZANAFLEX) 4 MG tablet Take 4 mg by mouth 2 (two) times daily as needed for spasms.  Marland Kitchen torsemide (DEMADEX) 10 MG tablet Take 1 tablet by mouth daily.  Marland Kitchen VIBERZI 100 MG TABS Take 100 mg by mouth 2 (two) times daily as needed. FOR IBS  . zolpidem (AMBIEN) 10 MG tablet Take 10 mg by mouth at bedtime.     Allergies:   Adalimumab, Baclofen, Hydromorphone, and Vilazodone   Social History   Socioeconomic History  . Marital status: Married    Spouse name: Peyton Najjar  . Number of children: 2  . Years of education: Not on file  . Highest education level: Associate degree: academic program  Occupational History  . Occupation: unemployed  Social Needs  . Financial resource strain: Not  on file  . Food insecurity    Worry: Not on file    Inability: Not on file  . Transportation needs    Medical: Not on file    Non-medical: Not on file  Tobacco Use  . Smoking status: Current Every Day Smoker  . Smokeless tobacco: Never Used  Substance and Sexual Activity  . Alcohol use: No  . Drug use: No  . Sexual activity: Never  Lifestyle  . Physical activity    Days per week: Not on file    Minutes per session: Not on file  . Stress: Not on file  Relationships  . Social Herbalist on phone: Not on file    Gets together: Not on file    Attends religious service: Not on file    Active member of club or organization: Not on file    Attends meetings of clubs or organizations: Not on file    Relationship status: Not on file  Other Topics Concern  . Not on file  Social History Narrative   Patient is right-handed. She lives  with her husband in a 2 level home, master on 1st. She drinks two to three 12 oz glasses of tea and same amount of of Dr. Malachi Bonds sodas. She does not exercise.     Family History: The patient's family history includes Anxiety disorder in her sister; Diabetes in her maternal grandmother and mother; Heart disease in her mother; Hypertension in her father and maternal grandmother; Kidney disease in her maternal grandmother; Thyroid disease in her mother. ROS:   Please see the history of present illness.    All 14 point review of systems negative except as described per history of present illness.  EKGs/Labs/Other Studies Reviewed:    The following studies were reviewed today: EKG done by primary care physician showed normal sinus rhythm, normal P interval, normal QS complex duration morphology   Recent Labs: No results found for requested labs within last 8760 hours.  Recent Lipid Panel No results found for: CHOL, TRIG, HDL, CHOLHDL, VLDL, LDLCALC, LDLDIRECT  Physical Exam:    VS:  BP (!) 138/98   Pulse 81   Ht 5\' 2"  (1.575 m)   Wt 166 lb 6.4 oz (75.5 kg)   SpO2 99%   BMI 30.43 kg/m     Wt Readings from Last 3 Encounters:  03/31/19 166 lb 6.4 oz (75.5 kg)  09/13/18 170 lb (77.1 kg)  10/29/16 150 lb (68 kg)     GEN:  Well nourished, well developed in no acute distress HEENT: Normal NECK: No JVD; No carotid bruits LYMPHATICS: No lymphadenopathy CARDIAC: RRR, no murmurs, no rubs, no gallops RESPIRATORY:  Clear to auscultation without rales, wheezing or rhonchi  ABDOMEN: Soft, non-tender, non-distended MUSCULOSKELETAL:  No edema; No deformity  SKIN: Warm and dry NEUROLOGIC:  Alert and oriented x 3 PSYCHIATRIC:  Normal affect   ASSESSMENT:    1. Palpitations   2. Dyspnea on exertion   3. Primary insomnia   4. Rheumatoid arthritis of wrist, unspecified laterality, unspecified rheumatoid factor presence (HCC)    PLAN:    In order of problems listed above:  1.  Palpitations, tachycardia.  I will ask her to wear 0 patch to see exactly without arrhythmia if any with dealing with. 2. Dyspnea on exertion.  She did have echocardiogram done in hospital in July apparently was normal we will get a copy of it. 3. Primary insomnia that being followed by psychotherapist. 4.  Rheumatoid arthritis now not treated.  May be contributing factor to her issues. 5. I suspect significant role here play some anxiety.  Well however the monitor to make sure there is no significant arrhythmia 6. Essential hypertension this young lady.  I will ask you to have renal duplex.   Medication Adjustments/Labs and Tests Ordered: Current medicines are reviewed at length with the patient today.  Concerns regarding medicines are outlined above.  No orders of the defined types were placed in this encounter.  No orders of the defined types were placed in this encounter.   Signed, Georgeanna Lea, MD, [facility]. 03/31/2019 2:44 PM    [facility]

## 2019-04-10 ENCOUNTER — Ambulatory Visit (INDEPENDENT_AMBULATORY_CARE_PROVIDER_SITE_OTHER): Payer: BC Managed Care – PPO

## 2019-04-10 DIAGNOSIS — R002 Palpitations: Secondary | ICD-10-CM | POA: Diagnosis not present

## 2019-04-12 DIAGNOSIS — G43909 Migraine, unspecified, not intractable, without status migrainosus: Secondary | ICD-10-CM | POA: Diagnosis not present

## 2019-04-12 DIAGNOSIS — F419 Anxiety disorder, unspecified: Secondary | ICD-10-CM | POA: Diagnosis not present

## 2019-04-12 DIAGNOSIS — R000 Tachycardia, unspecified: Secondary | ICD-10-CM | POA: Diagnosis not present

## 2019-04-12 DIAGNOSIS — I160 Hypertensive urgency: Secondary | ICD-10-CM | POA: Diagnosis not present

## 2019-04-12 DIAGNOSIS — Z79899 Other long term (current) drug therapy: Secondary | ICD-10-CM | POA: Diagnosis not present

## 2019-04-12 DIAGNOSIS — R51 Headache: Secondary | ICD-10-CM | POA: Diagnosis not present

## 2019-04-12 DIAGNOSIS — H5704 Mydriasis: Secondary | ICD-10-CM | POA: Diagnosis not present

## 2019-04-12 DIAGNOSIS — F17210 Nicotine dependence, cigarettes, uncomplicated: Secondary | ICD-10-CM | POA: Diagnosis not present

## 2019-04-19 DIAGNOSIS — R05 Cough: Secondary | ICD-10-CM | POA: Diagnosis not present

## 2019-04-19 DIAGNOSIS — I10 Essential (primary) hypertension: Secondary | ICD-10-CM | POA: Diagnosis not present

## 2019-05-03 DIAGNOSIS — Z046 Encounter for general psychiatric examination, requested by authority: Secondary | ICD-10-CM | POA: Diagnosis not present

## 2019-05-03 DIAGNOSIS — R079 Chest pain, unspecified: Secondary | ICD-10-CM | POA: Diagnosis not present

## 2019-05-03 DIAGNOSIS — F332 Major depressive disorder, recurrent severe without psychotic features: Secondary | ICD-10-CM | POA: Diagnosis not present

## 2019-05-03 DIAGNOSIS — G4700 Insomnia, unspecified: Secondary | ICD-10-CM | POA: Diagnosis not present

## 2019-05-03 DIAGNOSIS — F1320 Sedative, hypnotic or anxiolytic dependence, uncomplicated: Secondary | ICD-10-CM | POA: Diagnosis not present

## 2019-05-03 DIAGNOSIS — R072 Precordial pain: Secondary | ICD-10-CM | POA: Diagnosis not present

## 2019-05-03 DIAGNOSIS — F419 Anxiety disorder, unspecified: Secondary | ICD-10-CM | POA: Diagnosis not present

## 2019-05-03 DIAGNOSIS — G43919 Migraine, unspecified, intractable, without status migrainosus: Secondary | ICD-10-CM | POA: Diagnosis not present

## 2019-05-03 DIAGNOSIS — I10 Essential (primary) hypertension: Secondary | ICD-10-CM | POA: Diagnosis not present

## 2019-05-03 DIAGNOSIS — M069 Rheumatoid arthritis, unspecified: Secondary | ICD-10-CM | POA: Diagnosis not present

## 2019-05-03 DIAGNOSIS — G43809 Other migraine, not intractable, without status migrainosus: Secondary | ICD-10-CM | POA: Diagnosis not present

## 2019-05-03 DIAGNOSIS — R000 Tachycardia, unspecified: Secondary | ICD-10-CM | POA: Diagnosis not present

## 2019-05-03 DIAGNOSIS — R0789 Other chest pain: Secondary | ICD-10-CM | POA: Diagnosis not present

## 2019-05-03 DIAGNOSIS — R0602 Shortness of breath: Secondary | ICD-10-CM | POA: Diagnosis not present

## 2019-05-03 DIAGNOSIS — R112 Nausea with vomiting, unspecified: Secondary | ICD-10-CM | POA: Diagnosis not present

## 2019-05-03 DIAGNOSIS — R002 Palpitations: Secondary | ICD-10-CM | POA: Diagnosis not present

## 2019-05-03 DIAGNOSIS — R05 Cough: Secondary | ICD-10-CM | POA: Diagnosis not present

## 2019-05-03 DIAGNOSIS — R9431 Abnormal electrocardiogram [ECG] [EKG]: Secondary | ICD-10-CM | POA: Diagnosis not present

## 2019-05-03 DIAGNOSIS — R45851 Suicidal ideations: Secondary | ICD-10-CM | POA: Diagnosis not present

## 2019-05-03 DIAGNOSIS — F17210 Nicotine dependence, cigarettes, uncomplicated: Secondary | ICD-10-CM | POA: Diagnosis not present

## 2019-05-03 DIAGNOSIS — R509 Fever, unspecified: Secondary | ICD-10-CM | POA: Diagnosis not present

## 2019-05-03 DIAGNOSIS — G8929 Other chronic pain: Secondary | ICD-10-CM | POA: Diagnosis not present

## 2019-05-03 DIAGNOSIS — R197 Diarrhea, unspecified: Secondary | ICD-10-CM | POA: Diagnosis not present

## 2019-05-04 DIAGNOSIS — R45851 Suicidal ideations: Secondary | ICD-10-CM | POA: Diagnosis not present

## 2019-05-04 DIAGNOSIS — F332 Major depressive disorder, recurrent severe without psychotic features: Secondary | ICD-10-CM | POA: Diagnosis not present

## 2019-05-04 DIAGNOSIS — G43809 Other migraine, not intractable, without status migrainosus: Secondary | ICD-10-CM | POA: Diagnosis not present

## 2019-05-04 DIAGNOSIS — R9431 Abnormal electrocardiogram [ECG] [EKG]: Secondary | ICD-10-CM | POA: Diagnosis not present

## 2019-05-04 DIAGNOSIS — R000 Tachycardia, unspecified: Secondary | ICD-10-CM | POA: Diagnosis not present

## 2019-05-04 DIAGNOSIS — I10 Essential (primary) hypertension: Secondary | ICD-10-CM | POA: Diagnosis not present

## 2019-05-04 DIAGNOSIS — G43919 Migraine, unspecified, intractable, without status migrainosus: Secondary | ICD-10-CM | POA: Diagnosis not present

## 2019-05-04 DIAGNOSIS — Z046 Encounter for general psychiatric examination, requested by authority: Secondary | ICD-10-CM | POA: Diagnosis not present

## 2019-05-05 DIAGNOSIS — F329 Major depressive disorder, single episode, unspecified: Secondary | ICD-10-CM | POA: Diagnosis not present

## 2019-05-05 DIAGNOSIS — G43809 Other migraine, not intractable, without status migrainosus: Secondary | ICD-10-CM | POA: Diagnosis not present

## 2019-05-05 DIAGNOSIS — R000 Tachycardia, unspecified: Secondary | ICD-10-CM | POA: Diagnosis not present

## 2019-05-05 DIAGNOSIS — G43909 Migraine, unspecified, not intractable, without status migrainosus: Secondary | ICD-10-CM | POA: Diagnosis not present

## 2019-05-05 DIAGNOSIS — R002 Palpitations: Secondary | ICD-10-CM | POA: Diagnosis not present

## 2019-05-05 DIAGNOSIS — F1320 Sedative, hypnotic or anxiolytic dependence, uncomplicated: Secondary | ICD-10-CM | POA: Diagnosis not present

## 2019-05-05 DIAGNOSIS — G4700 Insomnia, unspecified: Secondary | ICD-10-CM | POA: Diagnosis not present

## 2019-05-05 DIAGNOSIS — I10 Essential (primary) hypertension: Secondary | ICD-10-CM | POA: Diagnosis not present

## 2019-05-05 DIAGNOSIS — G43919 Migraine, unspecified, intractable, without status migrainosus: Secondary | ICD-10-CM | POA: Diagnosis not present

## 2019-05-05 DIAGNOSIS — M069 Rheumatoid arthritis, unspecified: Secondary | ICD-10-CM | POA: Diagnosis not present

## 2019-05-05 DIAGNOSIS — F331 Major depressive disorder, recurrent, moderate: Secondary | ICD-10-CM | POA: Diagnosis not present

## 2019-05-05 DIAGNOSIS — Z046 Encounter for general psychiatric examination, requested by authority: Secondary | ICD-10-CM | POA: Diagnosis not present

## 2019-05-05 DIAGNOSIS — F429 Obsessive-compulsive disorder, unspecified: Secondary | ICD-10-CM | POA: Diagnosis not present

## 2019-05-05 DIAGNOSIS — R112 Nausea with vomiting, unspecified: Secondary | ICD-10-CM | POA: Diagnosis not present

## 2019-05-05 DIAGNOSIS — R197 Diarrhea, unspecified: Secondary | ICD-10-CM | POA: Diagnosis not present

## 2019-05-05 DIAGNOSIS — F17210 Nicotine dependence, cigarettes, uncomplicated: Secondary | ICD-10-CM | POA: Diagnosis not present

## 2019-05-05 DIAGNOSIS — Z62810 Personal history of physical and sexual abuse in childhood: Secondary | ICD-10-CM | POA: Diagnosis not present

## 2019-05-05 DIAGNOSIS — Z818 Family history of other mental and behavioral disorders: Secondary | ICD-10-CM | POA: Diagnosis not present

## 2019-05-05 DIAGNOSIS — Z5181 Encounter for therapeutic drug level monitoring: Secondary | ICD-10-CM | POA: Diagnosis not present

## 2019-05-05 DIAGNOSIS — F419 Anxiety disorder, unspecified: Secondary | ICD-10-CM | POA: Diagnosis not present

## 2019-05-05 DIAGNOSIS — R072 Precordial pain: Secondary | ICD-10-CM | POA: Diagnosis not present

## 2019-05-05 DIAGNOSIS — F332 Major depressive disorder, recurrent severe without psychotic features: Secondary | ICD-10-CM | POA: Diagnosis not present

## 2019-05-05 DIAGNOSIS — G8929 Other chronic pain: Secondary | ICD-10-CM | POA: Diagnosis not present

## 2019-05-05 DIAGNOSIS — R45851 Suicidal ideations: Secondary | ICD-10-CM | POA: Diagnosis not present

## 2019-05-12 DIAGNOSIS — F329 Major depressive disorder, single episode, unspecified: Secondary | ICD-10-CM | POA: Diagnosis not present

## 2019-05-29 DIAGNOSIS — Z01419 Encounter for gynecological examination (general) (routine) without abnormal findings: Secondary | ICD-10-CM | POA: Diagnosis not present

## 2019-05-29 DIAGNOSIS — Z3202 Encounter for pregnancy test, result negative: Secondary | ICD-10-CM | POA: Diagnosis not present

## 2019-05-29 DIAGNOSIS — F419 Anxiety disorder, unspecified: Secondary | ICD-10-CM

## 2019-05-29 DIAGNOSIS — F32A Depression, unspecified: Secondary | ICD-10-CM

## 2019-05-29 DIAGNOSIS — F3281 Premenstrual dysphoric disorder: Secondary | ICD-10-CM | POA: Insufficient documentation

## 2019-05-29 DIAGNOSIS — Z6829 Body mass index (BMI) 29.0-29.9, adult: Secondary | ICD-10-CM | POA: Diagnosis not present

## 2019-05-29 HISTORY — DX: Anxiety disorder, unspecified: F41.9

## 2019-05-29 HISTORY — DX: Depression, unspecified: F32.A

## 2019-05-29 HISTORY — DX: Premenstrual dysphoric disorder: F32.81

## 2019-05-31 DIAGNOSIS — F419 Anxiety disorder, unspecified: Secondary | ICD-10-CM | POA: Diagnosis not present

## 2019-06-05 ENCOUNTER — Other Ambulatory Visit: Payer: Self-pay

## 2019-06-05 ENCOUNTER — Ambulatory Visit (INDEPENDENT_AMBULATORY_CARE_PROVIDER_SITE_OTHER): Payer: BC Managed Care – PPO | Admitting: Cardiology

## 2019-06-05 ENCOUNTER — Encounter: Payer: Self-pay | Admitting: Cardiology

## 2019-06-05 VITALS — BP 130/80 | HR 104 | Ht 62.0 in | Wt 173.0 lb

## 2019-06-05 DIAGNOSIS — M069 Rheumatoid arthritis, unspecified: Secondary | ICD-10-CM

## 2019-06-05 DIAGNOSIS — G4720 Circadian rhythm sleep disorder, unspecified type: Secondary | ICD-10-CM

## 2019-06-05 DIAGNOSIS — I10 Essential (primary) hypertension: Secondary | ICD-10-CM

## 2019-06-05 DIAGNOSIS — R002 Palpitations: Secondary | ICD-10-CM | POA: Diagnosis not present

## 2019-06-05 DIAGNOSIS — R0600 Dyspnea, unspecified: Secondary | ICD-10-CM

## 2019-06-05 DIAGNOSIS — R0609 Other forms of dyspnea: Secondary | ICD-10-CM

## 2019-06-05 DIAGNOSIS — R0789 Other chest pain: Secondary | ICD-10-CM

## 2019-06-05 HISTORY — DX: Other chest pain: R07.89

## 2019-06-05 NOTE — Progress Notes (Signed)
Cardiology Office Note:    Date:  06/05/2019   ID:  Mary BUMGARNER, DOB 08/12/1980, MRN [id number]  PCP:  Renaldo Reel, PA  Cardiologist:  Jenne Campus, MD    Referring MD: Renaldo Reel, [place]   Chief Complaint  Patient presents with  . 1 month follow up  Of some chest pain  History of Present Illness:    Mary Dean is a 38 y.o. female with some anxiety as well as depression, recent visit in the emergency room because of atypical chest pain.  She also presented to me initially with a lot of complaint including shortness of breath fatigue tiredness palpitations.  She wore monitor which was negative only APCs and PVCs which are asymptomatic, echocardiogram was normal however today leading complaint appears to be chest pain she describes sharp stabbing-like chest pain going from the left side of her chest to the back.  The sensation can last for hours aching deep breath coughing does not make any difference she is worried that this is related to her heart.  The same time she has no difficulty walking climbing stairs.  Past Medical History:  Diagnosis Date  . Abnormal Pap smear   . Hypertension   . Migraines   . Raynaud's disease     Past Surgical History:  Procedure Laterality Date  . ANKLE SURGERY Right   . BREAST ENHANCEMENT SURGERY    . IVF    . WISDOM TOOTH EXTRACTION      Current Medications: Current Meds  Medication Sig  . clonazePAM (KLONOPIN) 0.5 MG tablet Take 0.5 mg by mouth 2 (two) times daily.  . cloNIDine (CATAPRES) 0.1 MG tablet Take 0.1 mg by mouth daily as needed. Hypertension as needed  . Erenumab-aooe (AIMOVIG, 140 MG DOSE,) 70 MG/ML SOAJ Inject 140 mg into the skin every 30 (thirty) days.  Marland Kitchen FLUoxetine (PROZAC) 20 MG tablet Take 20 mg by mouth daily.  . propranolol (INDERAL) 40 MG tablet Take 1 tablet by mouth 2 (two) times daily.  Marland Kitchen tiZANidine (ZANAFLEX) 4 MG tablet Take 4 mg by mouth 2 (two) times daily as needed for spasms.  . traZODone  (DESYREL) 100 MG tablet Take 50-100 mg by mouth at bedtime as needed.  Marland Kitchen VIBERZI 100 MG TABS Take 100 mg by mouth 2 (two) times daily as needed. FOR IBS     Allergies:   Adalimumab, Baclofen, Hydromorphone, and Vilazodone   Social History   Socioeconomic History  . Marital status: Married    Spouse name: [name]  . Number of children: 2  . Years of education: Not on file  . Highest education level: Associate degree: academic program  Occupational History  . Occupation: unemployed  Social Needs  . Financial resource strain: Not on file  . Food insecurity    Worry: Not on file    Inability: Not on file  . Transportation needs    Medical: Not on file    Non-medical: Not on file  Tobacco Use  . Smoking status: Current Every Day Smoker  . Smokeless tobacco: Never Used  Substance and Sexual Activity  . Alcohol use: No  . Drug use: No  . Sexual activity: Never  Lifestyle  . Physical activity    Days per week: Not on file    Minutes per session: Not on file  . Stress: Not on file  Relationships  . Social Herbalist on phone: Not on file    Gets together: Not  on file    Attends religious service: Not on file    Active member of club or organization: Not on file    Attends meetings of clubs or organizations: Not on file    Relationship status: Not on file  Other Topics Concern  . Not on file  Social History Narrative   Patient is right-handed. She lives with her husband in a 2 level home, master on 1st. She drinks two to three 12 oz glasses of tea and same amount of of Dr. Reino Kent sodas. She does not exercise.     Family History: The patient's family history includes Anxiety disorder in her sister; Diabetes in her maternal grandmother and mother; Heart disease in her mother; Hypertension in her father and maternal grandmother; Kidney disease in her maternal grandmother; Thyroid disease in her mother. ROS:   Please see the history of present illness.    All 14 point  review of systems negative except as described per history of present illness  EKGs/Labs/Other Studies Reviewed:      Recent Labs: No results found for requested labs within last 8760 hours.  Recent Lipid Panel No results found for: CHOL, TRIG, HDL, CHOLHDL, VLDL, LDLCALC, LDLDIRECT  Physical Exam:    VS:  BP 130/80   Pulse (!) 104   Ht 5\' 2"  (1.575 m)   Wt 173 lb (78.5 kg)   SpO2 98%   BMI 31.64 kg/m     Wt Readings from Last 3 Encounters:  06/05/19 173 lb (78.5 kg)  03/31/19 166 lb 6.4 oz (75.5 kg)  09/13/18 170 lb (77.1 kg)     GEN:  Well nourished, well developed in no acute distress HEENT: Normal NECK: No JVD; No carotid bruits LYMPHATICS: No lymphadenopathy CARDIAC: RRR, no murmurs, no rubs, no gallops RESPIRATORY:  Clear to auscultation without rales, wheezing or rhonchi  ABDOMEN: Soft, non-tender, non-distended MUSCULOSKELETAL:  No edema; No deformity  SKIN: Warm and dry LOWER EXTREMITIES: no swelling NEUROLOGIC:  Alert and oriented x 3 PSYCHIATRIC:  Normal affect   ASSESSMENT:    1. Essential hypertension   2. Atypical chest pain   3. Rheumatoid arthritis of wrist, unspecified laterality, unspecified whether rheumatoid factor present (HCC)   4. Palpitations   5. Dyspnea on exertion    PLAN:    In order of problems listed above:  1. Atypical chest pain.  We talked at length about what to do with the situation I think is best value in doing calcium score.  We will do the test. 2. Essential hypertension blood pressure appears to be well controlled without medications. 3. Palpitations described to have palpitation she can wake up in the middle of the night then she can start having her heart speeding up.  We talked about potentially doing sleep study and she did want to talk about it. 4. Dyspnea on exertion so far everything is negative.   Medication Adjustments/Labs and Tests Ordered: Current medicines are reviewed at length with the patient today.   Concerns regarding medicines are outlined above.  No orders of the defined types were placed in this encounter.  Medication changes: No orders of the defined types were placed in this encounter.   Signed, 09/15/18, MD, [facility] 06/05/2019 2:16 PM    [facility]

## 2019-06-05 NOTE — Patient Instructions (Signed)
Medication Instructions:  Your physician recommends that you continue on your current medications as directed. Please refer to the Current Medication list given to you today.  *If you need a refill on your cardiac medications before your next appointment, please call your pharmacy*  Lab Work: None.  If you have labs (blood work) drawn today and your tests are completely normal, you will receive your results only by: Marland Kitchen MyChart Message (if you have MyChart) OR . A paper copy in the mail If you have any lab test that is abnormal or we need to change your treatment, we will call you to review the results.  Testing/Procedures: Non-Cardiac CT scanning, (CAT scanning), is a noninvasive, special x-ray that produces cross-sectional images of the body using x-rays and a computer. CT scans help physicians diagnose and treat medical conditions. For some CT exams, a contrast material is used to enhance visibility in the area of the body being studied. CT scans provide greater clarity and reveal more details than regular x-ray exams.  Your physician has recommended that you have a sleep study. This test records several body functions during sleep, including: brain activity, eye movement, oxygen and carbon dioxide blood levels, heart rate and rhythm, breathing rate and rhythm, the flow of air through your mouth and nose, snoring, body muscle movements, and chest and belly movement.    Follow-Up: At [facility], you and your health needs are our priority.  As part of our continuing mission to provide you with exceptional heart care, we have created designated Provider Care Teams.  These Care Teams include your primary Cardiologist (physician) and Advanced Practice Providers (APPs -  Physician Assistants and Nurse Practitioners) who all work together to provide you with the care you need, when you need it.  Your next appointment:   1 month   The format for your next appointment:   In Person  Provider:    Jenne Campus, MD  Other Instructions    Sleep Studies A sleep study (polysomnogram) is a series of tests done while you are sleeping. A sleep study records your brain waves, heart rate, breathing rate, oxygen level, and eye and leg movements. A sleep study helps your health care provider:  See how well you sleep.  Diagnose a sleep disorder.  Determine how severe your sleep disorder is.  Create a plan to treat your sleep disorder. Your health care provider may recommend a sleep study if you:  Feel sleepy on most days.  Snore loudly while sleeping.  Have unusual behaviors while you sleep, such as walking.  Have brief periods in which you stop breathing during sleep (sleepapnea).  Fall asleep suddenly during the day (narcolepsy).  Have trouble falling asleep or staying asleep (insomnia).  Feel like you need to move your legs when trying to fall asleep (restless legs syndrome).  Move your legs by flexing and extending them regularly while asleep (periodic limb movement disorder).  Act out your dreams while you sleep (sleep behavior disorder).  Feel like you cannot move when you first wake up (sleep paralysis). What tests are part of a sleep study? Most sleep studies record the following during sleep:  Brain activity.  Eye movements.  Heart rate and rhythm.  Breathing rate and rhythm.  Blood-oxygen level.  Blood pressure.  Chest and belly movement as you breathe.  Arm and leg movements.  Snoring or other noises.  Body position. Where are sleep studies done? Sleep studies are done at sleep centers. A sleep center  may be inside a hospital, office, or clinic. The room where you have the study may look like a hospital room or a hotel room. The health care providers doing the study may come in and out of the room during the study. Most of the time, they will be in another room monitoring your test as you sleep. How are sleep studies done? Most sleep  studies are done during a normal period of time for a full night of sleep. You will arrive at the study center in the evening and go home in the morning. Before the test  Bring your pajamas and toothbrush with you to the sleep study.  Do not have caffeine on the day of your sleep study.  Do not drink alcohol on the day of your sleep study.  Your health care provider will let you know if you should stop taking any of your regular medicines before the test. During the test      Round, sticky patches with sensors attached to recording wires (electrodes) are placed on your scalp, face, chest, and limbs.  Wires from all the electrodes and sensors run from your bed to a computer. The wires can be taken off and put back on if you need to get out of bed to go to the bathroom.  A sensor is placed over your nose to measure airflow.  A finger clip is put on your finger or ear to measure your blood oxygen level (pulse oximetry).  A belt is placed around your belly and a belt is placed around your chest to measure breathing movements.  If you have signs of the sleep disorder called sleep apnea during your test, you may get a treatment mask to wear for the second half of the night. ? The mask provides positive airway pressure (PAP) to help you breathe better during sleep. This may greatly improve your sleep apnea. ? You will then have all tests done again with the mask in place to see if your measurements and recordings change. After the test  A medical doctor who specializes in sleep will evaluate the results of your sleep study and share them with you and your primary health care provider.  Based on your results, your medical history, and a physical exam, you may be diagnosed with a sleep disorder, such as: ? Sleep apnea. ? Restless legs syndrome. ? Sleep-related behavior disorder. ? Sleep-related movement disorders. ? Sleep-related seizure disorders.  Your health care team will help  determine your treatment options based on your diagnosis. This may include: ? Improving your sleep habits (sleep hygiene). ? Wearing a continuous positive airway pressure (CPAP) or bi-level positive airway pressure (BPAP) mask. ? Wearing an oral device at night to improve breathing and reduce snoring. ? Taking medicines. Follow these instructions at home:  Take over-the-counter and prescription medicines only as told by your health care provider.  If you are instructed to use a CPAP or BPAP mask, make sure you use it nightly as directed.  Make any lifestyle changes that your health care provider recommends.  If you were given a device to open your airway while you sleep, use it only as told by your health care provider.  Do not use any tobacco products, such as cigarettes, chewing tobacco, and e-cigarettes. If you need help quitting, ask your health care provider.  Keep all follow-up visits as told by your health care provider. This is important. Summary  A sleep study (polysomnogram) is a series of tests  done while you are sleeping. It shows how well you sleep.  Most sleep studies are done over one full night of sleep. You will arrive at the study center in the evening and go home in the morning.  If you have signs of the sleep disorder called sleep apnea during your test, you may get a treatment mask to wear for the second half of the night.  A medical doctor who specializes in sleep will evaluate the results of your sleep study and share them with your primary health care provider. This information is not intended to replace advice given to you by your health care provider. Make sure you discuss any questions you have with your health care provider. Document Released: 01/17/2003 Document Revised: 04/29/2018 Document Reviewed: 08/10/2017 Elsevier Patient Education  2020 Elsevier Inc.   Coronary Calcium Scan A coronary calcium scan is an imaging test used to look for deposits of  calcium and other fatty materials (plaques) in the inner lining of the blood vessels of the heart (coronary arteries). These deposits of calcium and plaques can partly clog and narrow the coronary arteries without producing any symptoms or warning signs. This puts a person at risk for a heart attack. This test can detect these deposits before symptoms develop. Tell a health care provider about:  Any allergies you have.  All medicines you are taking, including vitamins, herbs, eye drops, creams, and over-the-counter medicines.  Any problems you or family members have had with anesthetic medicines.  Any blood disorders you have.  Any surgeries you have had.  Any medical conditions you have.  Whether you are pregnant or may be pregnant. What are the risks? Generally, this is a safe procedure. However, problems may occur, including:  Harm to a pregnant woman and her unborn baby. This test involves the use of radiation. Radiation exposure can be dangerous to a pregnant woman and her unborn baby. If you are pregnant, you generally should not have this procedure done.  Slight increase in the risk of cancer. This is because of the radiation involved in the test. What happens before the procedure? No preparation is needed for this procedure. What happens during the procedure?   You will undress and remove any jewelry around your neck or chest.  You will put on a hospital gown.  Sticky electrodes will be placed on your chest. The electrodes will be connected to an electrocardiogram (ECG) machine to record a tracing of the electrical activity of your heart.  A CT scanner will take pictures of your heart. During this time, you will be asked to lie still and hold your breath for 2-3 seconds while a picture of your heart is being taken. The procedure may vary among health care providers and hospitals. What happens after the procedure?  You can get dressed.  You can return to your normal  activities.  It is up to you to get the results of your test. Ask your health care provider, or the department that is doing the test, when your results will be ready. Summary  A coronary calcium scan is an imaging test used to look for deposits of calcium and other fatty materials (plaques) in the inner lining of the blood vessels of the heart (coronary arteries).  Generally, this is a safe procedure. Tell your health care provider if you are pregnant or may be pregnant.  No preparation is needed for this procedure.  A CT scanner will take pictures of your heart.  You can  return to your normal activities after the scan is done. This information is not intended to replace advice given to you by your health care provider. Make sure you discuss any questions you have with your health care provider. Document Released: 01/09/2008 Document Revised: 06/25/2017 Document Reviewed: 06/01/2016 Elsevier Patient Education  2020 [employer]

## 2019-06-05 NOTE — Addendum Note (Signed)
Addended by: Ashok Norris on: 06/05/2019 02:31 PM   Modules accepted: Orders

## 2019-06-06 DIAGNOSIS — N939 Abnormal uterine and vaginal bleeding, unspecified: Secondary | ICD-10-CM | POA: Diagnosis not present

## 2019-06-06 DIAGNOSIS — N920 Excessive and frequent menstruation with regular cycle: Secondary | ICD-10-CM | POA: Diagnosis not present

## 2019-06-06 DIAGNOSIS — N946 Dysmenorrhea, unspecified: Secondary | ICD-10-CM | POA: Diagnosis not present

## 2019-06-07 ENCOUNTER — Telehealth: Payer: Self-pay | Admitting: Cardiology

## 2019-06-07 NOTE — Telephone Encounter (Signed)
Left message for patient to return call.

## 2019-06-07 NOTE — Telephone Encounter (Signed)
Called [facility] office trying to schedule CT and sleep study-they couldn't help her

## 2019-06-09 DIAGNOSIS — M069 Rheumatoid arthritis, unspecified: Secondary | ICD-10-CM | POA: Diagnosis not present

## 2019-06-09 DIAGNOSIS — I10 Essential (primary) hypertension: Secondary | ICD-10-CM | POA: Diagnosis not present

## 2019-06-09 DIAGNOSIS — Z1331 Encounter for screening for depression: Secondary | ICD-10-CM | POA: Diagnosis not present

## 2019-06-09 DIAGNOSIS — F419 Anxiety disorder, unspecified: Secondary | ICD-10-CM | POA: Diagnosis not present

## 2019-06-09 DIAGNOSIS — G43909 Migraine, unspecified, not intractable, without status migrainosus: Secondary | ICD-10-CM | POA: Diagnosis not present

## 2019-06-09 DIAGNOSIS — Z23 Encounter for immunization: Secondary | ICD-10-CM | POA: Diagnosis not present

## 2019-06-09 NOTE — Telephone Encounter (Signed)
Left message for patient to return call.

## 2019-06-15 NOTE — Telephone Encounter (Signed)
Left third message for patient to return call. Will remove from work que.

## 2019-06-20 ENCOUNTER — Telehealth: Payer: Self-pay | Admitting: *Deleted

## 2019-06-20 DIAGNOSIS — R002 Palpitations: Secondary | ICD-10-CM

## 2019-06-20 DIAGNOSIS — F5101 Primary insomnia: Secondary | ICD-10-CM

## 2019-06-20 NOTE — Telephone Encounter (Signed)
Staff message sent to [facility]. [employer] denied in lab sleep study. Approved HST. Auth # [id number]. Valid dates 06/20/19 to 08/18/19.

## 2019-06-28 NOTE — Addendum Note (Signed)
Addended by: Ashok Norris on: 06/28/2019 09:37 AM   Modules accepted: Orders

## 2019-06-28 NOTE — Telephone Encounter (Signed)
Left message with sleep study appt date and time. Advised patient to call with any questions.

## 2019-06-30 NOTE — Telephone Encounter (Signed)
Left another message for patient to return call.

## 2019-07-03 DIAGNOSIS — M25562 Pain in left knee: Secondary | ICD-10-CM | POA: Diagnosis not present

## 2019-07-05 ENCOUNTER — Other Ambulatory Visit: Payer: Self-pay

## 2019-07-05 ENCOUNTER — Telehealth: Payer: Self-pay | Admitting: *Deleted

## 2019-07-05 ENCOUNTER — Ambulatory Visit (INDEPENDENT_AMBULATORY_CARE_PROVIDER_SITE_OTHER): Payer: BC Managed Care – PPO | Admitting: Cardiology

## 2019-07-05 ENCOUNTER — Encounter: Payer: Self-pay | Admitting: Cardiology

## 2019-07-05 VITALS — BP 122/68 | HR 108 | Ht 62.0 in | Wt 172.6 lb

## 2019-07-05 DIAGNOSIS — R002 Palpitations: Secondary | ICD-10-CM | POA: Diagnosis not present

## 2019-07-05 DIAGNOSIS — M069 Rheumatoid arthritis, unspecified: Secondary | ICD-10-CM

## 2019-07-05 DIAGNOSIS — R0789 Other chest pain: Secondary | ICD-10-CM | POA: Diagnosis not present

## 2019-07-05 DIAGNOSIS — I10 Essential (primary) hypertension: Secondary | ICD-10-CM | POA: Diagnosis not present

## 2019-07-05 DIAGNOSIS — R0600 Dyspnea, unspecified: Secondary | ICD-10-CM

## 2019-07-05 DIAGNOSIS — R0609 Other forms of dyspnea: Secondary | ICD-10-CM

## 2019-07-05 NOTE — Progress Notes (Signed)
Cardiology Office Note:    Date:  07/05/2019   ID:  ARTEMIS KOLLER, DOB [birth year], MRN [id number]  PCP:  Marlyn Corporal, PA  Cardiologist:  Gypsy Balsam, MD    Referring MD: Marlyn Corporal, [place]   Chief Complaint  Patient presents with  . 1 month follow up    History of Present Illness:    Mary Dean is a 38 y.o. female with past medical history significant for rheumatoid arthritis, essential hypertension.  She presented to our office office with chief complaint of palpitations she did wear monitor which showed some PVCs and APCs.  Also she was complaining of having some atypical chest pain pain is sharp not related to exercise.  Another complaint that she had is the fact that she could wake up in the middle of the night with her heart going very fast and that is really very scary to her.  We had a long discussion about all this scenario.  She is scheduled to have a sleep study which I am awaiting to get.  She also is scheduled to have a calcium score mostly for prognostic reasons.  I did review her cholesterol profile which showed triglycerides being 332 and HDL of 27.  Seeing her calcium score will help [place] determine how aggressive we need to be with her management of cholesterol.  Past Medical History:  Diagnosis Date  . Abnormal Pap smear   . Hypertension   . Migraines   . Raynaud's disease     Past Surgical History:  Procedure Laterality Date  . ANKLE SURGERY Right   . BREAST ENHANCEMENT SURGERY    . IVF    . WISDOM TOOTH EXTRACTION      Current Medications: Current Meds  Medication Sig  . clonazePAM (KLONOPIN) 0.5 MG tablet Take 0.5 mg by mouth 2 (two) times daily.  . cloNIDine (CATAPRES) 0.1 MG tablet Take 0.1 mg by mouth daily as needed. Hypertension as needed  . Erenumab-aooe (AIMOVIG, 140 MG DOSE,) 70 MG/ML SOAJ Inject 140 mg into the skin every 30 (thirty) days.  . propranolol (INDERAL) 40 MG tablet Take 1 tablet by mouth 2 (two) times daily.  Marland Kitchen tiZANidine  (ZANAFLEX) 4 MG tablet Take 4 mg by mouth 2 (two) times daily as needed for spasms.  . traZODone (DESYREL) 150 MG tablet Take 300 mg by mouth at bedtime as needed.      Allergies:   Adalimumab, Baclofen, Hydromorphone, and Vilazodone   Social History   Socioeconomic History  . Marital status: Married    Spouse name: Peyton Najjar  . Number of children: 2  . Years of education: Not on file  . Highest education level: Associate degree: academic program  Occupational History  . Occupation: unemployed  Social Needs  . Financial resource strain: Not on file  . Food insecurity    Worry: Not on file    Inability: Not on file  . Transportation needs    Medical: Not on file    Non-medical: Not on file  Tobacco Use  . Smoking status: Current Every Day Smoker  . Smokeless tobacco: Never Used  Substance and Sexual Activity  . Alcohol use: No  . Drug use: No  . Sexual activity: Never  Lifestyle  . Physical activity    Days per week: Not on file    Minutes per session: Not on file  . Stress: Not on file  Relationships  . Social Musician on phone: Not  on file    Gets together: Not on file    Attends religious service: Not on file    Active member of club or organization: Not on file    Attends meetings of clubs or organizations: Not on file    Relationship status: Not on file  Other Topics Concern  . Not on file  Social History Narrative   Patient is right-handed. She lives with her husband in a 2 level home, master on 1st. She drinks two to three 12 oz glasses of tea and same amount of of Dr. Malachi Bonds sodas. She does not exercise.     Family History: The patient's family history includes Anxiety disorder in her sister; Diabetes in her maternal grandmother and mother; Heart disease in her mother; Hypertension in her father and maternal grandmother; Kidney disease in her maternal grandmother; Thyroid disease in her mother. ROS:   Please see the history of present illness.     All 14 point review of systems negative except as described per history of present illness  EKGs/Labs/Other Studies Reviewed:      Recent Labs: No results found for requested labs within last 8760 hours.  Recent Lipid Panel No results found for: CHOL, TRIG, HDL, CHOLHDL, VLDL, LDLCALC, LDLDIRECT  Physical Exam:    VS:  BP 122/68   Pulse (!) 108   Ht 5\' 2"  (1.575 m)   Wt 172 lb 9.6 oz (78.3 kg)   SpO2 98%   BMI 31.57 kg/m     Wt Readings from Last 3 Encounters:  07/05/19 172 lb 9.6 oz (78.3 kg)  06/05/19 173 lb (78.5 kg)  03/31/19 166 lb 6.4 oz (75.5 kg)     GEN:  Well nourished, well developed in no acute distress HEENT: Normal NECK: No JVD; No carotid bruits LYMPHATICS: No lymphadenopathy CARDIAC: RRR, no murmurs, no rubs, no gallops RESPIRATORY:  Clear to auscultation without rales, wheezing or rhonchi  ABDOMEN: Soft, non-tender, non-distended MUSCULOSKELETAL:  No edema; No deformity  SKIN: Warm and dry LOWER EXTREMITIES: no swelling NEUROLOGIC:  Alert and oriented x 3 PSYCHIATRIC:  Normal affect   ASSESSMENT:    1. Palpitations   2. Atypical chest pain   3. Dyspnea on exertion   4. Essential hypertension   5. Rheumatoid arthritis of wrist, unspecified laterality, unspecified whether rheumatoid factor present ([place])    PLAN:    In order of problems listed above:  1. Palpitations.  Seems to be doing fair from that point of view.  We will continue present conservative approach.  Awaiting calcium score for stratification of this problem. 2. Atypical chest pain I doubt very much this is a coronary artery disease but calcium score will be done for prognostic reasons. 3. Dyspnea on exertion echocardiogram showed normal left ventricle ejection fraction. 4. Essential hypertension that being well controlled. 5. Rheumatoid arthritis followed by internal medicine team and rheumatology. 6. Awaiting sleep study trying to see if she gets significant sleep apnea    Medication Adjustments/Labs and Tests Ordered: Current medicines are reviewed at length with the patient today.  Concerns regarding medicines are outlined above.  No orders of the defined types were placed in this encounter.  Medication changes: No orders of the defined types were placed in this encounter.   Signed, Park Liter, MD, [facility] 07/05/2019 1:49 PM    [place]

## 2019-07-05 NOTE — Telephone Encounter (Signed)
Called patient 3 times to schedule CT CA Score for DR Agustin Cree left message to call back to [phone]/ [facility] to sch 11/11, 12/8, 12/9, also LM w husband 12/9 to call back to [place] at [phone]

## 2019-07-05 NOTE — Patient Instructions (Signed)

## 2019-07-07 ENCOUNTER — Telehealth: Payer: Self-pay | Admitting: *Deleted

## 2019-07-07 NOTE — Telephone Encounter (Signed)
Called patient at [phone] today at 15:43 left detailed message to call back to [phone] to schedule CT CA Score for Dr. Agustin Cree.

## 2019-07-14 DIAGNOSIS — N946 Dysmenorrhea, unspecified: Secondary | ICD-10-CM | POA: Diagnosis not present

## 2019-07-14 DIAGNOSIS — R102 Pelvic and perineal pain: Secondary | ICD-10-CM | POA: Diagnosis not present

## 2019-07-14 DIAGNOSIS — N920 Excessive and frequent menstruation with regular cycle: Secondary | ICD-10-CM | POA: Diagnosis not present

## 2019-07-18 ENCOUNTER — Other Ambulatory Visit: Payer: Self-pay | Admitting: Obstetrics and Gynecology

## 2019-07-18 ENCOUNTER — Telehealth: Payer: Self-pay | Admitting: Emergency Medicine

## 2019-07-18 DIAGNOSIS — R102 Pelvic and perineal pain: Secondary | ICD-10-CM | POA: Diagnosis not present

## 2019-07-18 DIAGNOSIS — N800 Endometriosis of uterus: Secondary | ICD-10-CM | POA: Diagnosis not present

## 2019-07-18 DIAGNOSIS — N920 Excessive and frequent menstruation with regular cycle: Secondary | ICD-10-CM | POA: Diagnosis not present

## 2019-07-18 DIAGNOSIS — N946 Dysmenorrhea, unspecified: Secondary | ICD-10-CM | POA: Diagnosis not present

## 2019-07-18 DIAGNOSIS — N945 Secondary dysmenorrhea: Secondary | ICD-10-CM | POA: Diagnosis not present

## 2019-07-18 NOTE — Telephone Encounter (Signed)
Left message for patient to call back to discuss getting her scheduled for calcium score ct.

## 2019-08-02 DIAGNOSIS — Z03818 Encounter for observation for suspected exposure to other biological agents ruled out: Secondary | ICD-10-CM | POA: Diagnosis not present

## 2019-08-02 DIAGNOSIS — Z20828 Contact with and (suspected) exposure to other viral communicable diseases: Secondary | ICD-10-CM | POA: Diagnosis not present

## 2019-08-10 DIAGNOSIS — G43909 Migraine, unspecified, not intractable, without status migrainosus: Secondary | ICD-10-CM | POA: Diagnosis not present

## 2019-08-15 ENCOUNTER — Telehealth: Payer: Self-pay | Admitting: Cardiology

## 2019-08-15 NOTE — Telephone Encounter (Signed)
Left message to call back  

## 2019-08-15 NOTE — Telephone Encounter (Signed)
New Message     Pt is calling and says she had a Hysterectomy done and is experiencing some Symptoms now of higher BP readings      ( pt did not have available readings, she was driving) she says she has been having palpitations and Tachycardia.  Pt asked for an appointment, she is scheduled 08/16/19. She would also like for a nurse to call her so she can discuss     Please call back

## 2019-08-16 ENCOUNTER — Encounter: Payer: Self-pay | Admitting: Cardiology

## 2019-08-16 ENCOUNTER — Other Ambulatory Visit: Payer: Self-pay

## 2019-08-16 ENCOUNTER — Ambulatory Visit (INDEPENDENT_AMBULATORY_CARE_PROVIDER_SITE_OTHER): Payer: BC Managed Care – PPO | Admitting: Cardiology

## 2019-08-16 ENCOUNTER — Telehealth: Payer: Self-pay | Admitting: Cardiology

## 2019-08-16 VITALS — BP 128/82 | HR 117 | Ht 62.0 in | Wt 180.2 lb

## 2019-08-16 DIAGNOSIS — G43909 Migraine, unspecified, not intractable, without status migrainosus: Secondary | ICD-10-CM | POA: Insufficient documentation

## 2019-08-16 DIAGNOSIS — R002 Palpitations: Secondary | ICD-10-CM

## 2019-08-16 DIAGNOSIS — M069 Rheumatoid arthritis, unspecified: Secondary | ICD-10-CM

## 2019-08-16 DIAGNOSIS — I10 Essential (primary) hypertension: Secondary | ICD-10-CM

## 2019-08-16 DIAGNOSIS — R0789 Other chest pain: Secondary | ICD-10-CM | POA: Diagnosis not present

## 2019-08-16 MED ORDER — CLONIDINE HCL 0.1 MG PO TABS: 0.1000 mg | ORAL_TABLET | Freq: Every day | ORAL | 3 refills | Status: DC | PRN

## 2019-08-16 NOTE — Patient Instructions (Addendum)
Medication Instructions:  Your physician recommends that you continue on your current medications as directed. Please refer to the Current Medication list given to you today.  *If you need a refill on your cardiac medications before your next appointment, please call your pharmacy*  Lab Work: Your physician recommends that you return for lab work today: cbc/ diff, cmp   If you have labs (blood work) drawn today and your tests are completely normal, you will receive your results only by: Marland Kitchen MyChart Message (if you have MyChart) OR . A paper copy in the mail If you have any lab test that is abnormal or we need to change your treatment, we will call you to review the results.  Testing/Procedures: Your physician has requested that you have an echocardiogram. Echocardiography is a painless test that uses sound waves to create images of your heart. It provides your doctor with information about the size and shape of your heart and how well your heart's chambers and valves are working. This procedure takes approximately one hour. There are no restrictions for this procedure.    Follow-Up: At [facility], you and your health needs are our priority.  As part of our continuing mission to provide you with exceptional heart care, we have created designated Provider Care Teams.  These Care Teams include your primary Cardiologist (physician) and Advanced Practice Providers (APPs -  Physician Assistants and Nurse Practitioners) who all work together to provide you with the care you need, when you need it.  Your next appointment:   3 month(s)  The format for your next appointment:   In Person  Provider:   Jenne Campus, MD  Other Instructions   Echocardiogram An echocardiogram is a procedure that uses painless sound waves (ultrasound) to produce an image of the heart. Images from an echocardiogram can provide important information about:  Signs of coronary artery disease (CAD).  Aneurysm  detection. An aneurysm is a weak or damaged part of an artery wall that bulges out from the normal force of blood pumping through the body.  Heart size and shape. Changes in the size or shape of the heart can be associated with certain conditions, including heart failure, aneurysm, and CAD.  Heart muscle function.  Heart valve function.  Signs of a past heart attack.  Fluid buildup around the heart.  Thickening of the heart muscle.  A tumor or infectious growth around the heart valves. Tell a health care provider about:  Any allergies you have.  All medicines you are taking, including vitamins, herbs, eye drops, creams, and over-the-counter medicines.  Any blood disorders you have.  Any surgeries you have had.  Any medical conditions you have.  Whether you are pregnant or may be pregnant. What are the risks? Generally, this is a safe procedure. However, problems may occur, including:  Allergic reaction to dye (contrast) that may be used during the procedure. What happens before the procedure? No specific preparation is needed. You may eat and drink normally. What happens during the procedure?   An IV tube may be inserted into one of your veins.  You may receive contrast through this tube. A contrast is an injection that improves the quality of the pictures from your heart.  A gel will be applied to your chest.  A wand-like tool (transducer) will be moved over your chest. The gel will help to transmit the sound waves from the transducer.  The sound waves will harmlessly bounce off of your heart to allow the  heart images to be captured in real-time motion. The images will be recorded on a computer. The procedure may vary among health care providers and hospitals. What happens after the procedure?  You may return to your normal, everyday life, including diet, activities, and medicines, unless your health care provider tells you not to do that. Summary  An  echocardiogram is a procedure that uses painless sound waves (ultrasound) to produce an image of the heart.  Images from an echocardiogram can provide important information about the size and shape of your heart, heart muscle function, heart valve function, and fluid buildup around your heart.  You do not need to do anything to prepare before this procedure. You may eat and drink normally.  After the echocardiogram is completed, you may return to your normal, everyday life, unless your health care provider tells you not to do that. This information is not intended to replace advice given to you by your health care provider. Make sure you discuss any questions you have with your health care provider. Document Revised: 11/03/2018 Document Reviewed: 08/15/2016 Elsevier Patient Education  2020 [employer].

## 2019-08-16 NOTE — Telephone Encounter (Signed)
Lurena Joiner from [facility] calling for to clarify the patient's directions on how to take her cloNIDine (CATAPRES) 0.1 MG tablet.

## 2019-08-16 NOTE — Progress Notes (Signed)
Cardiology Office Note:    Date:  08/16/2019   ID:  Mary Dean, DOB 09-13-1980, MRN [id number]  PCP:  Renaldo Reel, PA  Cardiologist:  Jenne Campus, MD    Referring MD: Renaldo Reel, [place]   Chief Complaint  Patient presents with  . Palpitations    Edema / SOB / Chest Pain     History of Present Illness:    Mary Dean is a 39 y.o. female past medical history significant for palpitations, monitor revealed only infrequent PVCs and APCs.  She did have hysterectomy done which was transvaginal.  That happened about a month ago since that time she feels miserable she feels short of breath she feels palpitation she complained of having swelling some abdominal pain.  A lot of issues.  Also poor appetite.  Denies having any passing out.  No chest pain.  Past Medical History:  Diagnosis Date  . Abnormal Pap smear   . Hypertension   . Migraines   . Raynaud's disease     Past Surgical History:  Procedure Laterality Date  . ANKLE SURGERY Right   . BREAST ENHANCEMENT SURGERY    . IVF    . WISDOM TOOTH EXTRACTION      Current Medications: Current Meds  Medication Sig  . clonazePAM (KLONOPIN) 0.5 MG tablet Take 0.5 mg by mouth 2 (two) times daily.  . cloNIDine (CATAPRES) 0.1 MG tablet Take 0.1 mg by mouth daily as needed. Hypertension as needed  . Erenumab-aooe (AIMOVIG, 140 MG DOSE,) 70 MG/ML SOAJ Inject 140 mg into the skin every 30 (thirty) days.  . propranolol (INDERAL) 40 MG tablet Take 1 tablet by mouth 2 (two) times daily.  Marland Kitchen tiZANidine (ZANAFLEX) 4 MG tablet Take 4 mg by mouth 2 (two) times daily as needed for spasms.  . traZODone (DESYREL) 150 MG tablet Take 300 mg by mouth at bedtime as needed.      Allergies:   Adalimumab, Baclofen, Hydromorphone, and Vilazodone   Social History   Socioeconomic History  . Marital status: Married    Spouse name: [name]  . Number of children: 2  . Years of education: Not on file  . Highest education level: Associate  degree: academic program  Occupational History  . Occupation: unemployed  Tobacco Use  . Smoking status: Current Every Day Smoker  . Smokeless tobacco: Never Used  Substance and Sexual Activity  . Alcohol use: No  . Drug use: No  . Sexual activity: Never  Other Topics Concern  . Not on file  Social History Narrative   Patient is right-handed. She lives with her husband in a 2 level home, master on 1st. She drinks two to three 12 oz glasses of tea and same amount of of Dr. Malachi Bonds sodas. She does not exercise.   Social Determinants of Health   Financial Resource Strain:   . Difficulty of Paying Living Expenses: Not on file  Food Insecurity:   . Worried About Charity fundraiser in the Last Year: Not on file  . Ran Out of Food in the Last Year: Not on file  Transportation Needs:   . Lack of Transportation (Medical): Not on file  . Lack of Transportation (Non-Medical): Not on file  Physical Activity:   . Days of Exercise per Week: Not on file  . Minutes of Exercise per Session: Not on file  Stress:   . Feeling of Stress : Not on file  Social Connections:   . Frequency  of Communication with Friends and Family: Not on file  . Frequency of Social Gatherings with Friends and Family: Not on file  . Attends Religious Services: Not on file  . Active Member of Clubs or Organizations: Not on file  . Attends Banker Meetings: Not on file  . Marital Status: Not on file     Family History: The patient's family history includes Anxiety disorder in her sister; Diabetes in her maternal grandmother and mother; Heart disease in her mother; Hypertension in her father and maternal grandmother; Kidney disease in her maternal grandmother; Thyroid disease in her mother. ROS:   Please see the history of present illness.    All 14 point review of systems negative except as described per history of present illness  EKGs/Labs/Other Studies Reviewed:      Recent Labs: No results found  for requested labs within last 8760 hours.  Recent Lipid Panel No results found for: CHOL, TRIG, HDL, CHOLHDL, VLDL, LDLCALC, LDLDIRECT  Physical Exam:    VS:  BP 128/82   Pulse (!) 117   Ht 5\' 2"  (1.575 m)   Wt 180 lb 3.2 oz (81.7 kg)   SpO2 94%   BMI 32.96 kg/m     Wt Readings from Last 3 Encounters:  08/16/19 180 lb 3.2 oz (81.7 kg)  07/05/19 172 lb 9.6 oz (78.3 kg)  06/05/19 173 lb (78.5 kg)     GEN:  Well nourished, well developed in no acute distress HEENT: Normal NECK: No JVD; No carotid bruits LYMPHATICS: No lymphadenopathy CARDIAC: RRR, no murmurs, no rubs, no gallops RESPIRATORY:  Clear to auscultation without rales, wheezing or rhonchi  ABDOMEN: Soft, non-tender, non-distended MUSCULOSKELETAL:  No edema; No deformity  SKIN: Warm and dry LOWER EXTREMITIES: no swelling NEUROLOGIC:  Alert and oriented x 3 PSYCHIATRIC:  Normal affect   ASSESSMENT:    1. Palpitations   2. Atypical chest pain   3. Essential hypertension   4. Rheumatoid arthritis of wrist, unspecified laterality, unspecified whether rheumatoid factor present (HCC)    PLAN:    In order of problems listed above:  1. Palpitations complain of having dose.  I will check EKG on her. 2. Atypical chest pain denies having any 3. Essential hypertension blood pressure well controlled today. 4. Rheumatoid arthritis followed by 10 medicine team 5. Swelling but on the physical exam I do not see any pitting edema.  Her belly is soft nontender no rebound tenderness and bowel sounds are present.  She does not have difficulty with the bowel.  I will do EKG today.  I will also order CBC with differentiation, complete metabolic panel.  I will also schedule her to have echocardiogram.   Medication Adjustments/Labs and Tests Ordered: Current medicines are reviewed at length with the patient today.  Concerns regarding medicines are outlined above.  No orders of the defined types were placed in this  encounter.  Medication changes: No orders of the defined types were placed in this encounter.   Signed, 13/09/20, MD, [facility] 08/16/2019 4:00 PM    [facility]

## 2019-08-16 NOTE — Telephone Encounter (Signed)
Called pharmacy back and confirmed directions for medication.

## 2019-08-17 LAB — COMPREHENSIVE METABOLIC PANEL
ALT: 23 IU/L (ref 0–32)
AST: 18 IU/L (ref 0–40)
Albumin/Globulin Ratio: 1.4 (ref 1.2–2.2)
Albumin: 3.6 g/dL — ABNORMAL LOW (ref 3.8–4.8)
Alkaline Phosphatase: 100 IU/L (ref 39–117)
BUN/Creatinine Ratio: 8 — ABNORMAL LOW (ref 9–23)
BUN: 8 mg/dL (ref 6–20)
Bilirubin Total: <0.2 mg/dL (ref 0.0–1.2)
CO2: 24 mmol/L (ref 20–29)
Calcium: 8.5 mg/dL — ABNORMAL LOW (ref 8.7–10.2)
Chloride: 106 mmol/L (ref 96–106)
Creatinine, Ser: 1.01 mg/dL — ABNORMAL HIGH (ref 0.57–1.00)
GFR calc Af Amer: 82 mL/min/{1.73_m2} (ref >59)
GFR calc non Af Amer: 71 mL/min/{1.73_m2} (ref >59)
Globulin, Total: 2.5 g/dL (ref 1.5–4.5)
Glucose: 91 mg/dL (ref 65–99)
Potassium: 4.4 mmol/L (ref 3.5–5.2)
Sodium: 141 mmol/L (ref 134–144)
Total Protein: 6.1 g/dL (ref 6.0–8.5)

## 2019-08-17 LAB — CBC WITH DIFFERENTIAL/PLATELET
Basophils Absolute: 0.1 10*3/uL (ref 0.0–0.2)
Basos: 1 %
EOS (ABSOLUTE): 0.4 10*3/uL (ref 0.0–0.4)
Eos: 5 %
Hematocrit: 32.3 % — ABNORMAL LOW (ref 34.0–46.6)
Hemoglobin: 11.3 g/dL (ref 11.1–15.9)
Immature Grans (Abs): 0.1 10*3/uL (ref 0.0–0.1)
Immature Granulocytes: 1 %
Lymphocytes Absolute: 1.9 10*3/uL (ref 0.7–3.1)
Lymphs: 23 %
MCH: 31.3 pg (ref 26.6–33.0)
MCHC: 35 g/dL (ref 31.5–35.7)
MCV: 90 fL (ref 79–97)
Monocytes Absolute: 0.5 10*3/uL (ref 0.1–0.9)
Monocytes: 6 %
Neutrophils Absolute: 5.3 10*3/uL (ref 1.4–7.0)
Neutrophils: 64 %
Platelets: 252 10*3/uL (ref 150–450)
RBC: 3.61 x10E6/uL — ABNORMAL LOW (ref 3.77–5.28)
RDW: 13.2 % (ref 11.7–15.4)
WBC: 8.3 10*3/uL (ref 3.4–10.8)

## 2019-08-18 ENCOUNTER — Telehealth: Payer: Self-pay | Admitting: Cardiology

## 2019-08-18 NOTE — Telephone Encounter (Signed)
Patient states that she is requesting lab results from 08/16/19. Please call.

## 2019-08-18 NOTE — Telephone Encounter (Signed)
Called patient back and left voicemail that we would call her with results once they have been reviewed.

## 2019-08-22 DIAGNOSIS — G43909 Migraine, unspecified, not intractable, without status migrainosus: Secondary | ICD-10-CM | POA: Diagnosis not present

## 2019-08-23 ENCOUNTER — Telehealth: Payer: Self-pay | Admitting: Emergency Medicine

## 2019-08-23 NOTE — Telephone Encounter (Signed)
Left lab results on voicemail per dpr. Advised patient to call with any questions.

## 2019-08-24 ENCOUNTER — Encounter (HOSPITAL_BASED_OUTPATIENT_CLINIC_OR_DEPARTMENT_OTHER): Payer: BC Managed Care – PPO | Admitting: Cardiovascular Disease

## 2019-08-30 ENCOUNTER — Other Ambulatory Visit: Payer: Self-pay | Admitting: *Deleted

## 2019-08-30 ENCOUNTER — Telehealth: Payer: Self-pay | Admitting: Cardiology

## 2019-08-30 DIAGNOSIS — F329 Major depressive disorder, single episode, unspecified: Secondary | ICD-10-CM | POA: Diagnosis not present

## 2019-08-30 MED ORDER — CLONIDINE HCL 0.1 MG PO TABS: 0.1000 mg | ORAL_TABLET | Freq: Every day | ORAL | 3 refills | Status: DC | PRN

## 2019-08-30 MED ORDER — PROPRANOLOL HCL 40 MG PO TABS: 40.0000 mg | ORAL_TABLET | Freq: Two times a day (BID) | ORAL | 1 refills | Status: DC

## 2019-08-30 NOTE — Telephone Encounter (Signed)
*  STAT* If patient is at the pharmacy, call can be transferred to refill team.   1. Which medications need to be refilled? (please list name of each medication and dose if known)  cloNIDine (CATAPRES) 0.1 MG tablet propranolol (INDERAL) 40 MG tablet  2. Which pharmacy/location (including street and city if local pharmacy) is medication to be sent to? [facility] - [place], [place] - [address]  3. Do they need a 30 day or 90 day supply? 90 day

## 2019-08-30 NOTE — Telephone Encounter (Signed)
Refills sent to [facility]

## 2019-08-30 NOTE — Telephone Encounter (Signed)
    Patient calling for lab results 

## 2019-08-30 NOTE — Telephone Encounter (Signed)
Mailbox is full.

## 2019-09-01 NOTE — Telephone Encounter (Signed)
Attempted to contact patient again to discuss lab work, went to [employer], and voicemail was full.  Will try again.

## 2019-09-13 NOTE — Telephone Encounter (Signed)
Not able to get in contact with patient to schedule CT CA Score made office aware

## 2019-09-28 DIAGNOSIS — L309 Dermatitis, unspecified: Secondary | ICD-10-CM | POA: Diagnosis not present

## 2019-10-03 ENCOUNTER — Ambulatory Visit: Payer: BC Managed Care – PPO | Admitting: Cardiology

## 2019-10-06 ENCOUNTER — Other Ambulatory Visit: Payer: BC Managed Care – PPO

## 2019-10-19 ENCOUNTER — Ambulatory Visit (INDEPENDENT_AMBULATORY_CARE_PROVIDER_SITE_OTHER): Payer: BC Managed Care – PPO

## 2019-10-19 ENCOUNTER — Other Ambulatory Visit: Payer: Self-pay

## 2019-10-19 DIAGNOSIS — R002 Palpitations: Secondary | ICD-10-CM

## 2019-10-19 DIAGNOSIS — R0789 Other chest pain: Secondary | ICD-10-CM

## 2019-10-19 DIAGNOSIS — I10 Essential (primary) hypertension: Secondary | ICD-10-CM | POA: Diagnosis not present

## 2019-10-19 NOTE — Progress Notes (Signed)
Complete echocardiogram has been performed.   jimmy Yola Paradiso RDCS, RVT

## 2019-11-01 DIAGNOSIS — F329 Major depressive disorder, single episode, unspecified: Secondary | ICD-10-CM | POA: Diagnosis not present

## 2019-11-09 ENCOUNTER — Other Ambulatory Visit: Payer: Self-pay

## 2019-11-10 ENCOUNTER — Telehealth: Payer: Self-pay | Admitting: Cardiology

## 2019-11-10 ENCOUNTER — Other Ambulatory Visit: Payer: Self-pay

## 2019-11-10 ENCOUNTER — Encounter: Payer: Self-pay | Admitting: Cardiology

## 2019-11-10 ENCOUNTER — Ambulatory Visit (INDEPENDENT_AMBULATORY_CARE_PROVIDER_SITE_OTHER): Payer: BC Managed Care – PPO | Admitting: Cardiology

## 2019-11-10 VITALS — BP 122/80 | HR 82 | Temp 97.0°F | Ht 63.0 in | Wt 176.2 lb

## 2019-11-10 DIAGNOSIS — R002 Palpitations: Secondary | ICD-10-CM

## 2019-11-10 DIAGNOSIS — M069 Rheumatoid arthritis, unspecified: Secondary | ICD-10-CM | POA: Diagnosis not present

## 2019-11-10 DIAGNOSIS — I10 Essential (primary) hypertension: Secondary | ICD-10-CM

## 2019-11-10 DIAGNOSIS — R0789 Other chest pain: Secondary | ICD-10-CM

## 2019-11-10 MED ORDER — CLONIDINE HCL 0.1 MG PO TABS: 0.1000 mg | ORAL_TABLET | Freq: Once | ORAL | 1 refills | Status: DC

## 2019-11-10 NOTE — Patient Instructions (Signed)
Medication Instructions:  START Clonidine 0.1 MG by mouth Daily. *If you need a refill on your cardiac medications before your next appointment, please call your pharmacy*   Lab Work: None today If you have labs (blood work) drawn today and your tests are completely normal, you will receive your results only by: Marland Kitchen MyChart Message (if you have MyChart) OR . A paper copy in the mail If you have any lab test that is abnormal or we need to change your treatment, we will call you to review the results.   Testing/Procedures: None today   Follow-Up: At [facility], you and your health needs are our priority.  As part of our continuing mission to provide you with exceptional heart care, we have created designated Provider Care Teams.  These Care Teams include your primary Cardiologist (physician) and Advanced Practice Providers (APPs -  Physician Assistants and Nurse Practitioners) who all work together to provide you with the care you need, when you need it.  We recommend signing up for the patient portal called "MyChart".  Sign up information is provided on this After Visit Summary.  MyChart is used to connect with patients for Virtual Visits (Telemedicine).  Patients are able to view lab/test results, encounter notes, upcoming appointments, etc.  Non-urgent messages can be sent to your provider as well.   To learn more about what you can do with MyChart, go to [email or web address].    Your next appointment:   6 month(s)  The format for your next appointment:   In Person  Provider:   Gypsy Balsam, MD   Other Instructions Your next apt is Friday October 22 at 12:40 pm

## 2019-11-10 NOTE — Progress Notes (Signed)
Cardiology Office Note:    Date:  11/10/2019   ID:  Mary Dean, DOB 01/08/1981, MRN [id number]  PCP:  Renaldo Reel, PA  Cardiologist:  Jenne Campus, MD    Referring MD: Renaldo Reel, PA   No chief complaint on file. M doing fine  History of Present Illness:    Mary Dean is a 39 y.o. female who was referred to [place] because of palpitations, but she also have difficult to control essential hypertension with multiple intolerance to medications.  Last time I seen her she was still having a lot of problems.  Before last visit she had a transvaginal hysterectomy done because of endometriosis.  She is to sore retrospectively recheck she was simply still recovering from a surgery.  She is doing well she has more energy she is able to walk climb stairs with no difficulties.  No palpitations no dizziness no vomiting.  Past Medical History:  Diagnosis Date  . Abnormal Pap smear   . Atypical chest pain 06/05/2019  . Dyspnea on exertion 03/31/2019  . Essential hypertension 03/31/2019  . Hypertension   . Insomnia 03/31/2019  . Migraines   . Palpitations 03/31/2019  . Raynaud's disease   . Rheumatoid arthritis ([place]) 03/31/2019  . Rocky Mountain spotted fever 01/17/2019    Past Surgical History:  Procedure Laterality Date  . ANKLE SURGERY Right   . BREAST ENHANCEMENT SURGERY    . IVF    . WISDOM TOOTH EXTRACTION      Current Medications: Current Meds  Medication Sig  . cloNIDine (CATAPRES) 0.1 MG tablet Take 0.1 mg by mouth 2 (two) times daily.  Eduard Roux (AIMOVIG, 140 MG DOSE,) 70 MG/ML SOAJ Inject 140 mg into the skin every 30 (thirty) days.  . promethazine (PHENERGAN) 25 MG tablet Take 1 tablet by mouth every 12 (twelve) hours as needed.  . propranolol (INDERAL) 40 MG tablet Take 1 tablet (40 mg total) by mouth 2 (two) times daily.  Marland Kitchen tiZANidine (ZANAFLEX) 4 MG tablet Take 4 mg by mouth 2 (two) times daily as needed for spasms.     Allergies:   Adalimumab, Baclofen,  Hydromorphone, and Vilazodone   Social History   Socioeconomic History  . Marital status: Married    Spouse name: Mary Dean  . Number of children: 2  . Years of education: Not on file  . Highest education level: Associate degree: academic program  Occupational History  . Occupation: unemployed  Tobacco Use  . Smoking status: Current Every Day Smoker  . Smokeless tobacco: Never Used  Substance and Sexual Activity  . Alcohol use: No  . Drug use: No  . Sexual activity: Never  Other Topics Concern  . Not on file  Social History Narrative   Patient is right-handed. She lives with her husband in a 2 level home, master on 1st. She drinks two to three 12 oz glasses of tea and same amount of of Dr. Malachi Bonds sodas. She does not exercise.   Social Determinants of Health   Financial Resource Strain:   . Difficulty of Paying Living Expenses:   Food Insecurity:   . Worried About Charity fundraiser in the Last Year:   . Arboriculturist in the Last Year:   Transportation Needs:   . Film/video editor (Medical):   Marland Kitchen Lack of Transportation (Non-Medical):   Physical Activity:   . Days of Exercise per Week:   . Minutes of Exercise per Session:   Stress:   .  Feeling of Stress :   Social Connections:   . Frequency of Communication with Friends and Family:   . Frequency of Social Gatherings with Friends and Family:   . Attends Religious Services:   . Active Member of Clubs or Organizations:   . Attends Banker Meetings:   Marland Kitchen Marital Status:      Family History: The patient's family history includes Anxiety disorder in her sister; Diabetes in her maternal grandmother and mother; Heart disease in her mother; Hypertension in her father and maternal grandmother; Kidney disease in her maternal grandmother; Thyroid disease in her mother. ROS:   Please see the history of present illness.    All 14 point review of systems negative except as described per history of present  illness  EKGs/Labs/Other Studies Reviewed:      Recent Labs: 08/16/2019: ALT 23; BUN 8; Creatinine, Ser 1.01; Hemoglobin 11.3; Platelets 252; Potassium 4.4; Sodium 141  Recent Lipid Panel No results found for: CHOL, TRIG, HDL, CHOLHDL, VLDL, LDLCALC, LDLDIRECT  Physical Exam:    VS:  BP 122/80   Pulse 82   Temp (!) 97 F (36.1 C)   Ht 5\' 3"  (1.6 m)   Wt 176 lb 3.2 oz (79.9 kg)   SpO2 97%   BMI 31.21 kg/m     Wt Readings from Last 3 Encounters:  11/10/19 176 lb 3.2 oz (79.9 kg)  08/16/19 180 lb 3.2 oz (81.7 kg)  07/05/19 172 lb 9.6 oz (78.3 kg)     GEN:  Well nourished, well developed in no acute distress HEENT: Normal NECK: No JVD; No carotid bruits LYMPHATICS: No lymphadenopathy CARDIAC: RRR, no murmurs, no rubs, no gallops RESPIRATORY:  Clear to auscultation without rales, wheezing or rhonchi  ABDOMEN: Soft, non-tender, non-distended MUSCULOSKELETAL:  No edema; No deformity  SKIN: Warm and dry LOWER EXTREMITIES: no swelling NEUROLOGIC:  Alert and oriented x 3 PSYCHIATRIC:  Normal affect   ASSESSMENT:    1. Essential hypertension   2. Atypical chest pain   3. Rheumatoid arthritis of wrist, unspecified laterality, unspecified whether rheumatoid factor present (HCC)   4. Palpitations    PLAN:    In order of problems listed above:  1. Essential hypertension blood pressure well controlled with strange medication at her age [age] twice daily but it looks like to stay on combination of medication she can tolerate.  I will also continue with beta-blocker. 2. Palpitations: Denies having any. 3. Rheumatoid arthritis followed by rheumatologist. 4. Atypical chest pain denies having any  I did review echocardiogram with her which was normal   Medication Adjustments/Labs and Tests Ordered: Current medicines are reviewed at length with the patient today.  Concerns regarding medicines are outlined above.  No orders of the defined types were placed in this  encounter.  Medication changes:  Meds ordered this encounter  Medications  . cloNIDine (CATAPRES) 0.1 MG tablet    Sig: Take 1 tablet (0.1 mg total) by mouth once for 1 dose.    Dispense:  90 tablet    Refill:  1    Signed, 14/09/20, MD, [facility] 11/10/2019 3:58 PM    [facility]

## 2019-11-10 NOTE — Telephone Encounter (Signed)
Clarified with pharmacy that Clonidine 0.1 mg  tablet is to be taking BID.

## 2019-11-10 NOTE — Telephone Encounter (Signed)
Pt c/o medication issue:  1. Name of Medication:  cloNIDine (CATAPRES) 0.1 MG tablet  2. How are you currently taking this medication (dosage and times per day)? Take one tablet by mouth once for one dose  3. Are you having a reaction (difficulty breathing--STAT)? no  4. What is your medication issue? Pharmacy calling for clarification on how the patient is to take the medication. Transferred call to Tiffany.

## 2019-11-21 DIAGNOSIS — S30861A Insect bite (nonvenomous) of abdominal wall, initial encounter: Secondary | ICD-10-CM | POA: Diagnosis not present

## 2019-11-21 DIAGNOSIS — W57XXXA Bitten or stung by nonvenomous insect and other nonvenomous arthropods, initial encounter: Secondary | ICD-10-CM | POA: Diagnosis not present

## 2019-11-21 DIAGNOSIS — G43909 Migraine, unspecified, not intractable, without status migrainosus: Secondary | ICD-10-CM | POA: Diagnosis not present

## 2020-01-24 DIAGNOSIS — J019 Acute sinusitis, unspecified: Secondary | ICD-10-CM | POA: Diagnosis not present

## 2020-01-24 DIAGNOSIS — R300 Dysuria: Secondary | ICD-10-CM | POA: Diagnosis not present

## 2020-02-21 DIAGNOSIS — F419 Anxiety disorder, unspecified: Secondary | ICD-10-CM | POA: Diagnosis not present

## 2020-02-21 DIAGNOSIS — G43909 Migraine, unspecified, not intractable, without status migrainosus: Secondary | ICD-10-CM | POA: Diagnosis not present

## 2020-02-21 DIAGNOSIS — R197 Diarrhea, unspecified: Secondary | ICD-10-CM | POA: Diagnosis not present

## 2020-03-18 DIAGNOSIS — M791 Myalgia, unspecified site: Secondary | ICD-10-CM | POA: Diagnosis not present

## 2020-03-18 DIAGNOSIS — R5383 Other fatigue: Secondary | ICD-10-CM | POA: Diagnosis not present

## 2020-03-18 DIAGNOSIS — R519 Headache, unspecified: Secondary | ICD-10-CM | POA: Diagnosis not present

## 2020-03-18 DIAGNOSIS — Z20828 Contact with and (suspected) exposure to other viral communicable diseases: Secondary | ICD-10-CM | POA: Diagnosis not present

## 2020-03-18 DIAGNOSIS — R070 Pain in throat: Secondary | ICD-10-CM | POA: Diagnosis not present

## 2020-05-16 DIAGNOSIS — I10 Essential (primary) hypertension: Secondary | ICD-10-CM | POA: Insufficient documentation

## 2020-05-16 DIAGNOSIS — I7300 Raynaud's syndrome without gangrene: Secondary | ICD-10-CM | POA: Insufficient documentation

## 2020-05-17 ENCOUNTER — Other Ambulatory Visit: Payer: Self-pay

## 2020-05-17 ENCOUNTER — Ambulatory Visit (INDEPENDENT_AMBULATORY_CARE_PROVIDER_SITE_OTHER): Payer: BC Managed Care – PPO | Admitting: Cardiology

## 2020-05-17 ENCOUNTER — Encounter: Payer: Self-pay | Admitting: Cardiology

## 2020-05-17 VITALS — BP 94/65 | HR 70 | Ht 63.0 in | Wt 172.0 lb

## 2020-05-17 DIAGNOSIS — R0609 Other forms of dyspnea: Secondary | ICD-10-CM

## 2020-05-17 DIAGNOSIS — R0600 Dyspnea, unspecified: Secondary | ICD-10-CM

## 2020-05-17 DIAGNOSIS — R0789 Other chest pain: Secondary | ICD-10-CM

## 2020-05-17 DIAGNOSIS — I10 Essential (primary) hypertension: Secondary | ICD-10-CM | POA: Diagnosis not present

## 2020-05-17 MED ORDER — CLONIDINE HCL 0.1 MG PO TABS: 0.1000 mg | ORAL_TABLET | Freq: Two times a day (BID) | ORAL | 1 refills | Status: DC

## 2020-05-17 MED ORDER — PROPRANOLOL HCL 40 MG PO TABS: 40.0000 mg | ORAL_TABLET | Freq: Two times a day (BID) | ORAL | 1 refills | Status: DC

## 2020-05-17 NOTE — Patient Instructions (Signed)
Medication Instructions:  Your physician recommends that you continue on your current medications as directed. Please refer to the Current Medication list given to you today.  *If you need a refill on your cardiac medications before your next appointment, please call your pharmacy*   Lab Work: None ordered   Testing/Procedures: None ordered   Follow-Up: At [facility], you and your health needs are our priority.  As part of our continuing mission to provide you with exceptional heart care, we have created designated Provider Care Teams.  These Care Teams include your primary Cardiologist (physician) and Advanced Practice Providers (APPs -  Physician Assistants and Nurse Practitioners) who all work together to provide you with the care you need, when you need it.  We recommend signing up for the patient portal called "MyChart".  Sign up information is provided on this After Visit Summary.  MyChart is used to connect with patients for Virtual Visits (Telemedicine).  Patients are able to view lab/test results, encounter notes, upcoming appointments, etc.  Non-urgent messages can be sent to your provider as well.   To learn more about what you can do with MyChart, go to [email or web address].    Your next appointment:   4 week(s)  The format for your next appointment:   In Person  Provider:   Gypsy Balsam, MD    Thank you for choosing [facility]!!     Other Instructions  Check you blood pressure daily for next week/two.  Please call the office and let us know the results.

## 2020-05-17 NOTE — Progress Notes (Signed)
Cardiology Office Note:    Date:  05/17/2020   ID:  Mary Dean, DOB October 18, 1980, MRN [id number]  PCP:  Jim Like, NP  Cardiologist:  Gypsy Balsam, MD    Referring MD: Marlyn Corporal, [place]   Chief Complaint  Patient presents with  . Follow-up  M doing fine  History of Present Illness:    Mary Dean is a 39 y.o. female with past medical history significant for palpitations, essential hypertension which is difficult to control with intolerance to multiple medications.  She comes today 2 months for follow-up overall she seems to be doing well but her blood pressure today is 180/110.  Only medication she takes is propranolol.  She takes clonidine on as-needed basis and she does not want to take it when she got headache she check her blood pressure at the time she take it.  Past Medical History:  Diagnosis Date  . Abnormal Pap smear   . Atypical chest pain 06/05/2019  . Dyspnea on exertion 03/31/2019  . Essential hypertension 03/31/2019  . Hypertension   . Insomnia 03/31/2019  . Migraines   . Palpitations 03/31/2019  . Raynaud's disease   . Rheumatoid arthritis (HCC) 03/31/2019  . Rocky Mountain spotted fever 01/17/2019    Past Surgical History:  Procedure Laterality Date  . ANKLE SURGERY Right   . BREAST ENHANCEMENT SURGERY    . IVF    . WISDOM TOOTH EXTRACTION      Current Medications: Current Meds  Medication Sig  . cloNIDine (CATAPRES) 0.1 MG tablet Take 0.1 mg by mouth 2 (two) times daily.  Dorise Hiss (AIMOVIG, 140 MG DOSE,) 70 MG/ML SOAJ Inject 140 mg into the skin every 30 (thirty) days.  . promethazine (PHENERGAN) 25 MG tablet Take 1 tablet by mouth every 12 (twelve) hours as needed.  . propranolol (INDERAL) 40 MG tablet Take 1 tablet (40 mg total) by mouth 2 (two) times daily.  Marland Kitchen tiZANidine (ZANAFLEX) 4 MG tablet Take 4 mg by mouth 2 (two) times daily as needed for spasms.     Allergies:   Adalimumab, Baclofen, Hydromorphone, and Vilazodone   Social  History   Socioeconomic History  . Marital status: Married    Spouse name: Peyton Najjar  . Number of children: 2  . Years of education: Not on file  . Highest education level: Associate degree: academic program  Occupational History  . Occupation: unemployed  Tobacco Use  . Smoking status: Current Every Day Smoker  . Smokeless tobacco: Never Used  Vaping Use  . Vaping Use: Never used  Substance and Sexual Activity  . Alcohol use: No  . Drug use: No  . Sexual activity: Never  Other Topics Concern  . Not on file  Social History Narrative   Patient is right-handed. She lives with her husband in a 2 level home, master on 1st. She drinks two to three 12 oz glasses of tea and same amount of of Dr. Reino Kent sodas. She does not exercise.   Social Determinants of Health   Financial Resource Strain:   . Difficulty of Paying Living Expenses: Not on file  Food Insecurity:   . Worried About Programme researcher, broadcasting/film/video in the Last Year: Not on file  . Ran Out of Food in the Last Year: Not on file  Transportation Needs:   . Lack of Transportation (Medical): Not on file  . Lack of Transportation (Non-Medical): Not on file  Physical Activity:   . Days of Exercise per  Week: Not on file  . Minutes of Exercise per Session: Not on file  Stress:   . Feeling of Stress : Not on file  Social Connections:   . Frequency of Communication with Friends and Family: Not on file  . Frequency of Social Gatherings with Friends and Family: Not on file  . Attends Religious Services: Not on file  . Active Member of Clubs or Organizations: Not on file  . Attends Banker Meetings: Not on file  . Marital Status: Not on file     Family History: The patient's family history includes Anxiety disorder in her sister; Diabetes in her maternal grandmother and mother; Heart disease in her mother; Hypertension in her father and maternal grandmother; Kidney disease in her maternal grandmother; Thyroid disease in her  mother. ROS:   Please see the history of present illness.    All 14 point review of systems negative except as described per history of present illness  EKGs/Labs/Other Studies Reviewed:      Recent Labs: 08/16/2019: ALT 23; BUN 8; Creatinine, Ser 1.01; Hemoglobin 11.3; Platelets 252; Potassium 4.4; Sodium 141  Recent Lipid Panel No results found for: CHOL, TRIG, HDL, CHOLHDL, VLDL, LDLCALC, LDLDIRECT  Physical Exam:    VS:  BP 94/65 (BP Location: Left Arm, Patient Position: Sitting, Cuff Size: Normal)   Pulse 70   Ht 5\' 3"  (1.6 m)   Wt 172 lb (78 kg)   SpO2 98%   BMI 30.47 kg/m     Wt Readings from Last 3 Encounters:  05/17/20 172 lb (78 kg)  11/10/19 176 lb 3.2 oz (79.9 kg)  08/16/19 180 lb 3.2 oz (81.7 kg)     GEN:  Well nourished, well developed in no acute distress HEENT: Normal NECK: No JVD; No carotid bruits LYMPHATICS: No lymphadenopathy CARDIAC: RRR, no murmurs, no rubs, no gallops RESPIRATORY:  Clear to auscultation without rales, wheezing or rhonchi  ABDOMEN: Soft, non-tender, non-distended MUSCULOSKELETAL:  No edema; No deformity  SKIN: Warm and dry LOWER EXTREMITIES: no swelling NEUROLOGIC:  Alert and oriented x 3 PSYCHIATRIC:  Normal affect   ASSESSMENT:    1. Essential hypertension   2. Dyspnea on exertion   3. Atypical chest pain    PLAN:    In order of problems listed above:  1. Essential hypertension difficult to control she would be ideal patient for 24 hours blood pressure monitor.  We order one but we did not receive yet.  I asked her in the meantime to check her blood pressure three times a day for next week or 10 days and bring results to 08/18/19. 2. Dyspnea on exertion stable denies have any worsening of the problem. 3. Atypical chest pain: Denies having any.   Medication Adjustments/Labs and Tests Ordered: Current medicines are reviewed at length with the patient today.  Concerns regarding medicines are outlined above.  No orders of the  defined types were placed in this encounter.  Medication changes: No orders of the defined types were placed in this encounter.   Signed, [place], MD, [facility] 05/17/2020 12:54 PM    [facility]

## 2020-06-12 DIAGNOSIS — Z20822 Contact with and (suspected) exposure to covid-19: Secondary | ICD-10-CM | POA: Diagnosis not present

## 2020-06-12 DIAGNOSIS — S0501XA Injury of conjunctiva and corneal abrasion without foreign body, right eye, initial encounter: Secondary | ICD-10-CM | POA: Diagnosis not present

## 2020-06-14 ENCOUNTER — Ambulatory Visit: Payer: BC Managed Care – PPO | Admitting: Cardiology

## 2020-06-26 DIAGNOSIS — F419 Anxiety disorder, unspecified: Secondary | ICD-10-CM | POA: Diagnosis not present

## 2020-06-26 DIAGNOSIS — R739 Hyperglycemia, unspecified: Secondary | ICD-10-CM | POA: Diagnosis not present

## 2020-06-26 DIAGNOSIS — G43909 Migraine, unspecified, not intractable, without status migrainosus: Secondary | ICD-10-CM | POA: Diagnosis not present

## 2020-06-26 DIAGNOSIS — E669 Obesity, unspecified: Secondary | ICD-10-CM | POA: Diagnosis not present

## 2020-07-10 ENCOUNTER — Other Ambulatory Visit: Payer: Self-pay

## 2020-07-11 ENCOUNTER — Ambulatory Visit: Payer: BC Managed Care – PPO | Admitting: Cardiology

## 2020-07-11 DIAGNOSIS — R6889 Other general symptoms and signs: Secondary | ICD-10-CM | POA: Diagnosis not present

## 2020-07-11 DIAGNOSIS — Z20822 Contact with and (suspected) exposure to covid-19: Secondary | ICD-10-CM | POA: Diagnosis not present

## 2020-07-11 DIAGNOSIS — R062 Wheezing: Secondary | ICD-10-CM | POA: Diagnosis not present

## 2020-07-14 DIAGNOSIS — R059 Cough, unspecified: Secondary | ICD-10-CM | POA: Diagnosis not present

## 2020-07-14 DIAGNOSIS — J9801 Acute bronchospasm: Secondary | ICD-10-CM | POA: Diagnosis not present

## 2020-07-14 DIAGNOSIS — J019 Acute sinusitis, unspecified: Secondary | ICD-10-CM | POA: Diagnosis not present

## 2020-07-17 DIAGNOSIS — J189 Pneumonia, unspecified organism: Secondary | ICD-10-CM | POA: Diagnosis not present

## 2020-07-17 DIAGNOSIS — Z6832 Body mass index (BMI) 32.0-32.9, adult: Secondary | ICD-10-CM | POA: Diagnosis not present

## 2020-07-17 DIAGNOSIS — R059 Cough, unspecified: Secondary | ICD-10-CM | POA: Diagnosis not present

## 2020-07-17 DIAGNOSIS — M069 Rheumatoid arthritis, unspecified: Secondary | ICD-10-CM | POA: Diagnosis not present

## 2020-08-26 DIAGNOSIS — Z20822 Contact with and (suspected) exposure to covid-19: Secondary | ICD-10-CM | POA: Diagnosis not present

## 2020-08-26 DIAGNOSIS — R6889 Other general symptoms and signs: Secondary | ICD-10-CM | POA: Diagnosis not present

## 2020-08-26 DIAGNOSIS — U071 COVID-19: Secondary | ICD-10-CM | POA: Diagnosis not present

## 2020-09-18 ENCOUNTER — Other Ambulatory Visit: Payer: Self-pay

## 2020-09-18 DIAGNOSIS — F329 Major depressive disorder, single episode, unspecified: Secondary | ICD-10-CM

## 2020-09-18 DIAGNOSIS — L209 Atopic dermatitis, unspecified: Secondary | ICD-10-CM

## 2020-09-18 DIAGNOSIS — N179 Acute kidney failure, unspecified: Secondary | ICD-10-CM

## 2020-09-18 DIAGNOSIS — M810 Age-related osteoporosis without current pathological fracture: Secondary | ICD-10-CM | POA: Insufficient documentation

## 2020-09-18 DIAGNOSIS — F411 Generalized anxiety disorder: Secondary | ICD-10-CM | POA: Insufficient documentation

## 2020-09-18 DIAGNOSIS — K580 Irritable bowel syndrome with diarrhea: Secondary | ICD-10-CM

## 2020-09-18 DIAGNOSIS — E559 Vitamin D deficiency, unspecified: Secondary | ICD-10-CM

## 2020-09-18 DIAGNOSIS — R4689 Other symptoms and signs involving appearance and behavior: Secondary | ICD-10-CM

## 2020-09-18 DIAGNOSIS — N926 Irregular menstruation, unspecified: Secondary | ICD-10-CM

## 2020-09-18 DIAGNOSIS — L409 Psoriasis, unspecified: Secondary | ICD-10-CM | POA: Insufficient documentation

## 2020-09-18 DIAGNOSIS — F3132 Bipolar disorder, current episode depressed, moderate: Secondary | ICD-10-CM

## 2020-09-18 DIAGNOSIS — I713 Abdominal aortic aneurysm, ruptured, unspecified: Secondary | ICD-10-CM

## 2020-09-18 DIAGNOSIS — G43919 Migraine, unspecified, intractable, without status migrainosus: Secondary | ICD-10-CM | POA: Insufficient documentation

## 2020-09-18 DIAGNOSIS — W57XXXA Bitten or stung by nonvenomous insect and other nonvenomous arthropods, initial encounter: Secondary | ICD-10-CM

## 2020-09-18 DIAGNOSIS — Z79899 Other long term (current) drug therapy: Secondary | ICD-10-CM

## 2020-09-18 DIAGNOSIS — E782 Mixed hyperlipidemia: Secondary | ICD-10-CM

## 2020-09-18 DIAGNOSIS — R000 Tachycardia, unspecified: Secondary | ICD-10-CM

## 2020-09-18 HISTORY — DX: Tachycardia, unspecified: R00.0

## 2020-09-18 HISTORY — DX: Mixed hyperlipidemia: E78.2

## 2020-09-18 HISTORY — DX: Major depressive disorder, single episode, unspecified: F32.9

## 2020-09-18 HISTORY — DX: Bitten or stung by nonvenomous insect and other nonvenomous arthropods, initial encounter: W57.XXXA

## 2020-09-18 HISTORY — DX: Psoriasis, unspecified: L40.9

## 2020-09-18 HISTORY — DX: Atopic dermatitis, unspecified: L20.9

## 2020-09-18 HISTORY — DX: Generalized anxiety disorder: F41.1

## 2020-09-18 HISTORY — DX: Irritable bowel syndrome with diarrhea: K58.0

## 2020-09-18 HISTORY — DX: Age-related osteoporosis without current pathological fracture: M81.0

## 2020-09-18 HISTORY — DX: Acute kidney failure, unspecified: N17.9

## 2020-09-18 HISTORY — DX: Abdominal aortic aneurysm, ruptured: I71.3

## 2020-09-18 HISTORY — DX: Other symptoms and signs involving appearance and behavior: R46.89

## 2020-09-18 HISTORY — DX: Abdominal aortic aneurysm, ruptured, unspecified: I71.30

## 2020-09-18 HISTORY — DX: Irregular menstruation, unspecified: N92.6

## 2020-09-18 HISTORY — DX: Other long term (current) drug therapy: Z79.899

## 2020-09-18 HISTORY — DX: Migraine, unspecified, intractable, without status migrainosus: G43.919

## 2020-09-18 HISTORY — DX: Bipolar disorder, current episode depressed, moderate: F31.32

## 2020-09-18 HISTORY — DX: Vitamin D deficiency, unspecified: E55.9

## 2020-09-23 DIAGNOSIS — G4489 Other headache syndrome: Secondary | ICD-10-CM | POA: Diagnosis not present

## 2020-09-23 DIAGNOSIS — R112 Nausea with vomiting, unspecified: Secondary | ICD-10-CM | POA: Diagnosis not present

## 2020-09-26 ENCOUNTER — Other Ambulatory Visit: Payer: Self-pay

## 2020-09-26 ENCOUNTER — Ambulatory Visit (INDEPENDENT_AMBULATORY_CARE_PROVIDER_SITE_OTHER): Payer: BC Managed Care – PPO | Admitting: Cardiology

## 2020-09-26 ENCOUNTER — Encounter: Payer: Self-pay | Admitting: Cardiology

## 2020-09-26 VITALS — BP 102/60 | HR 84 | Ht 62.0 in | Wt 172.0 lb

## 2020-09-26 DIAGNOSIS — I10 Essential (primary) hypertension: Secondary | ICD-10-CM | POA: Diagnosis not present

## 2020-09-26 DIAGNOSIS — R0789 Other chest pain: Secondary | ICD-10-CM

## 2020-09-26 DIAGNOSIS — R002 Palpitations: Secondary | ICD-10-CM

## 2020-09-26 NOTE — Progress Notes (Signed)
Cardiology Office Note:    Date:  09/26/2020   ID:  Mary Dean, DOB 03-21-1981, MRN [id number]  PCP:  Marlyn Corporal, PA  Cardiologist:  Gypsy Balsam, MD    Referring MD: Jim Like, NP   Chief Complaint  Patient presents with  . follow up BP monitor    History of Present Illness:    Mary Dean is a 40 y.o. female with past medical history significant for essential hypertension, dyspnea on exertion, atypical chest pain.  She comes today 2 months of follow-up overall she is doing well she is trying to lose weight and she is very disappointed because it seems not to be working.  The only thing she eats is chicken with some broccoli.  She drinks plenty of water.  Her blood pressure seems to be on the lower side today.  She said when she check her blood pressure at home is always good about 120/70.  Past Medical History:  Diagnosis Date  . Abnormal Pap smear   . Acute renal failure syndrome (HCC) 09/18/2020  . Atopic dermatitis 09/18/2020  . Atypical chest pain 06/05/2019  . Bipolar affective disorder, currently depressed, moderate (HCC) 09/18/2020  . Bite of nonvenomous arthropod 09/18/2020  . Compulsive behavior 09/18/2020  . Dyspnea on exertion 03/31/2019  . Essential hypertension 03/31/2019  . Generalized anxiety disorder 09/18/2020  . Hypertension   . Insomnia 03/31/2019  . Irregular menstruation 09/18/2020  . Irritable bowel syndrome with diarrhea 09/18/2020  . Major depression, single episode 09/18/2020  . Migraines   . Mixed hyperlipidemia 09/18/2020  . Osteoporosis 09/18/2020  . Other long term (current) drug therapy 09/18/2020  . Palpitations 03/31/2019  . Psoriasis 09/18/2020  . Raynaud's disease   . Refractory migraine with aura 09/18/2020  . Rheumatoid arthritis (HCC) 03/31/2019  . Rocky Mountain spotted fever 01/17/2019  . Ruptured abdominal aortic aneurysm (HCC) 09/18/2020  . Tachycardia 09/18/2020  . Vitamin D deficiency 09/18/2020    Past Surgical History:   Procedure Laterality Date  . ANKLE SURGERY Right   . BREAST ENHANCEMENT SURGERY    . IVF    . WISDOM TOOTH EXTRACTION      Current Medications: Current Meds  Medication Sig  . cloNIDine (CATAPRES) 0.1 MG tablet Take 1 tablet (0.1 mg total) by mouth 2 (two) times daily.  Dorise Hiss 70 MG/ML SOAJ Inject 140 mg into the skin every 30 (thirty) days.  Marland Kitchen ibuprofen (ADVIL) 800 MG tablet Take 800 mg by mouth every 8 (eight) hours as needed.  . NURTEC 75 MG TBDP Take 1 tablet by mouth at bedtime.  . promethazine (PHENERGAN) 25 MG tablet Take 1 tablet by mouth every 12 (twelve) hours as needed for nausea.  . propranolol (INDERAL) 40 MG tablet Take 1 tablet (40 mg total) by mouth 2 (two) times daily.  Marland Kitchen SAXENDA 18 MG/3ML SOPN Inject 3 mg into the skin daily.  Marland Kitchen tiZANidine (ZANAFLEX) 4 MG tablet Take 4 mg by mouth 2 (two) times daily as needed for spasms.  . traZODone (DESYREL) 150 MG tablet Take 150 mg by mouth daily.  . [DISCONTINUED] Erenumab-aooe (AIMOVIG) 140 MG/ML SOAJ Inject 140 mg into the skin daily.     Allergies:   Adalimumab, Baclofen, Hydromorphone, Sumatriptan, Topiramate, Topiramate er, Vilazodone, Vilazodone hcl, and Amitriptyline hcl   Social History   Socioeconomic History  . Marital status: Married    Spouse name: Peyton Najjar  . Number of children: 2  . Years of education: Not on  file  . Highest education level: Associate degree: academic program  Occupational History  . Occupation: unemployed  Tobacco Use  . Smoking status: Current Every Day Smoker  . Smokeless tobacco: Never Used  Vaping Use  . Vaping Use: Never used  Substance and Sexual Activity  . Alcohol use: No  . Drug use: No  . Sexual activity: Never  Other Topics Concern  . Not on file  Social History Narrative   Patient is right-handed. She lives with her husband in a 2 level home, master on 1st. She drinks two to three 12 oz glasses of tea and same amount of of Dr. Reino Kent sodas. She does not exercise.    Social Determinants of Health   Financial Resource Strain: Not on file  Food Insecurity: Not on file  Transportation Needs: Not on file  Physical Activity: Not on file  Stress: Not on file  Social Connections: Not on file     Family History: The patient's family history includes Anxiety disorder in her sister; Diabetes in her maternal grandmother and mother; Heart disease in her mother; Hypertension in her father and maternal grandmother; Kidney disease in her maternal grandmother; Thyroid disease in her mother. ROS:   Please see the history of present illness.    All 14 point review of systems negative except as described per history of present illness  EKGs/Labs/Other Studies Reviewed:      Recent Labs: No results found for requested labs within last 8760 hours.  Recent Lipid Panel No results found for: CHOL, TRIG, HDL, CHOLHDL, VLDL, LDLCALC, LDLDIRECT  Physical Exam:    VS:  BP 102/60 (BP Location: Left Arm, Patient Position: Sitting)   Pulse 84   Ht 5\' 2"  (1.575 m)   Wt 172 lb (78 kg)   SpO2 97%   BMI 31.46 kg/m     Wt Readings from Last 3 Encounters:  09/26/20 172 lb (78 kg)  05/17/20 172 lb (78 kg)  11/10/19 176 lb 3.2 oz (79.9 kg)     GEN:  Well nourished, well developed in no acute distress HEENT: Normal NECK: No JVD; No carotid bruits LYMPHATICS: No lymphadenopathy CARDIAC: RRR, no murmurs, no rubs, no gallops RESPIRATORY:  Clear to auscultation without rales, wheezing or rhonchi  ABDOMEN: Soft, non-tender, non-distended MUSCULOSKELETAL:  No edema; No deformity  SKIN: Warm and dry LOWER EXTREMITIES: no swelling NEUROLOGIC:  Alert and oriented x 3 PSYCHIATRIC:  Normal affect   ASSESSMENT:    1. Palpitations   2. Primary hypertension   3. Atypical chest pain    PLAN:    In order of problems listed above:  1. Palpitations.  Denies having any we will continue monitoring. 2. Essential hypertension for some reason she was scheduled to have  arterial duplex evaluation of renal arteries and did not happen.  We will make arrangements for the test however first I would ask her to wear 24 blood pressure monitor. 3. Atypical chest pain denies having any. 4. Weight issue.  I did spend great left time talking to her about need to lose some weight and she understands she will try to do that.   Medication Adjustments/Labs and Tests Ordered: Current medicines are reviewed at length with the patient today.  Concerns regarding medicines are outlined above.  No orders of the defined types were placed in this encounter.  Medication changes: No orders of the defined types were placed in this encounter.   Signed, 11/12/19, MD, [facility] 09/26/2020 4:56 PM  [facility]

## 2020-09-26 NOTE — Patient Instructions (Signed)
Medication Instructions:  Your physician recommends that you continue on your current medications as directed. Please refer to the Current Medication list given to you today.  *If you need a refill on your cardiac medications before your next appointment, please call your pharmacy*   Lab Work: None If you have labs (blood work) drawn today and your tests are completely normal, you will receive your results only by: Marland Kitchen MyChart Message (if you have MyChart) OR . A paper copy in the mail If you have any lab test that is abnormal or we need to change your treatment, we will call you to review the results.   Testing/Procedures: 24 hour bp monitor    Follow-Up: At [facility], you and your health needs are our priority.  As part of our continuing mission to provide you with exceptional heart care, we have created designated Provider Care Teams.  These Care Teams include your primary Cardiologist (physician) and Advanced Practice Providers (APPs -  Physician Assistants and Nurse Practitioners) who all work together to provide you with the care you need, when you need it.  We recommend signing up for the patient portal called "MyChart".  Sign up information is provided on this After Visit Summary.  MyChart is used to connect with patients for Virtual Visits (Telemedicine).  Patients are able to view lab/test results, encounter notes, upcoming appointments, etc.  Non-urgent messages can be sent to your provider as well.   To learn more about what you can do with MyChart, go to [email or web address].    Your next appointment:   3 month(s)  The format for your next appointment:   In Person  Provider:   Gypsy Balsam, MD   Other Instructions

## 2020-09-27 ENCOUNTER — Telehealth: Payer: Self-pay | Admitting: *Deleted

## 2020-09-27 DIAGNOSIS — I10 Essential (primary) hypertension: Secondary | ICD-10-CM

## 2020-09-27 NOTE — Telephone Encounter (Signed)
Called pt and scheduled 24 HR BP Monitor per Dr. Bing Matter

## 2020-10-01 ENCOUNTER — Other Ambulatory Visit: Payer: Self-pay

## 2020-10-01 ENCOUNTER — Ambulatory Visit (INDEPENDENT_AMBULATORY_CARE_PROVIDER_SITE_OTHER): Payer: BC Managed Care – PPO

## 2020-10-01 DIAGNOSIS — I10 Essential (primary) hypertension: Secondary | ICD-10-CM | POA: Diagnosis not present

## 2020-10-01 DIAGNOSIS — L719 Rosacea, unspecified: Secondary | ICD-10-CM | POA: Diagnosis not present

## 2020-11-19 DIAGNOSIS — G43909 Migraine, unspecified, not intractable, without status migrainosus: Secondary | ICD-10-CM | POA: Diagnosis not present

## 2020-11-19 DIAGNOSIS — Z6831 Body mass index (BMI) 31.0-31.9, adult: Secondary | ICD-10-CM | POA: Diagnosis not present

## 2020-12-25 ENCOUNTER — Other Ambulatory Visit: Payer: Self-pay

## 2020-12-27 ENCOUNTER — Ambulatory Visit: Payer: BC Managed Care – PPO | Admitting: Cardiology

## 2020-12-27 ENCOUNTER — Telehealth: Payer: Self-pay | Admitting: Emergency Medicine

## 2020-12-27 DIAGNOSIS — I10 Essential (primary) hypertension: Secondary | ICD-10-CM

## 2020-12-27 NOTE — Telephone Encounter (Signed)
CMP ordered for patient per Dr. Bing Matter.

## 2020-12-28 LAB — COMPREHENSIVE METABOLIC PANEL
ALT: 10 IU/L (ref 0–32)
AST: 13 IU/L (ref 0–40)
Albumin/Globulin Ratio: 1.3 (ref 1.2–2.2)
Albumin: 3.9 g/dL (ref 3.8–4.8)
Alkaline Phosphatase: 85 IU/L (ref 44–121)
BUN/Creatinine Ratio: 6 — ABNORMAL LOW (ref 9–23)
BUN: 6 mg/dL (ref 6–20)
Bilirubin Total: 0.3 mg/dL (ref 0.0–1.2)
CO2: 22 mmol/L (ref 20–29)
Calcium: 8.6 mg/dL — ABNORMAL LOW (ref 8.7–10.2)
Chloride: 101 mmol/L (ref 96–106)
Creatinine, Ser: 1.04 mg/dL — ABNORMAL HIGH (ref 0.57–1.00)
Globulin, Total: 3 g/dL (ref 1.5–4.5)
Glucose: 104 mg/dL — ABNORMAL HIGH (ref 65–99)
Potassium: 3.6 mmol/L (ref 3.5–5.2)
Sodium: 141 mmol/L (ref 134–144)
Total Protein: 6.9 g/dL (ref 6.0–8.5)
eGFR: 70 mL/min/{1.73_m2} (ref >59)

## 2020-12-30 ENCOUNTER — Telehealth: Payer: Self-pay

## 2020-12-30 NOTE — Telephone Encounter (Signed)
-----   Message from Georgeanna Lea, MD sent at 12/30/2020 10:29 AM EDT ----- Labs are looking good, continue present management

## 2020-12-30 NOTE — Telephone Encounter (Signed)
Patient notified of results.

## 2021-01-03 ENCOUNTER — Ambulatory Visit: Payer: BC Managed Care – PPO | Admitting: Cardiology

## 2021-02-03 ENCOUNTER — Other Ambulatory Visit: Payer: Self-pay | Admitting: Cardiology

## 2021-02-20 DIAGNOSIS — G43909 Migraine, unspecified, not intractable, without status migrainosus: Secondary | ICD-10-CM | POA: Diagnosis not present

## 2021-02-20 DIAGNOSIS — F419 Anxiety disorder, unspecified: Secondary | ICD-10-CM | POA: Diagnosis not present

## 2021-02-20 DIAGNOSIS — E669 Obesity, unspecified: Secondary | ICD-10-CM | POA: Diagnosis not present

## 2021-02-20 DIAGNOSIS — R739 Hyperglycemia, unspecified: Secondary | ICD-10-CM | POA: Diagnosis not present

## 2021-03-08 DIAGNOSIS — Z20822 Contact with and (suspected) exposure to covid-19: Secondary | ICD-10-CM | POA: Diagnosis not present

## 2021-04-06 DIAGNOSIS — G43009 Migraine without aura, not intractable, without status migrainosus: Secondary | ICD-10-CM | POA: Diagnosis not present

## 2021-05-01 DIAGNOSIS — Z90710 Acquired absence of both cervix and uterus: Secondary | ICD-10-CM | POA: Diagnosis not present

## 2021-05-01 DIAGNOSIS — R0602 Shortness of breath: Secondary | ICD-10-CM | POA: Diagnosis not present

## 2021-05-01 DIAGNOSIS — Q8909 Congenital malformations of spleen: Secondary | ICD-10-CM | POA: Diagnosis not present

## 2021-05-01 DIAGNOSIS — R918 Other nonspecific abnormal finding of lung field: Secondary | ICD-10-CM | POA: Diagnosis not present

## 2021-05-01 DIAGNOSIS — N9489 Other specified conditions associated with female genital organs and menstrual cycle: Secondary | ICD-10-CM | POA: Diagnosis not present

## 2021-05-01 DIAGNOSIS — D72829 Elevated white blood cell count, unspecified: Secondary | ICD-10-CM | POA: Diagnosis not present

## 2021-05-01 DIAGNOSIS — Z720 Tobacco use: Secondary | ICD-10-CM | POA: Diagnosis not present

## 2021-05-01 DIAGNOSIS — M7989 Other specified soft tissue disorders: Secondary | ICD-10-CM | POA: Diagnosis not present

## 2021-05-01 DIAGNOSIS — K7689 Other specified diseases of liver: Secondary | ICD-10-CM | POA: Diagnosis not present

## 2021-05-01 DIAGNOSIS — R1013 Epigastric pain: Secondary | ICD-10-CM | POA: Diagnosis not present

## 2021-05-01 DIAGNOSIS — R932 Abnormal findings on diagnostic imaging of liver and biliary tract: Secondary | ICD-10-CM | POA: Diagnosis not present

## 2021-05-01 DIAGNOSIS — Z9882 Breast implant status: Secondary | ICD-10-CM | POA: Diagnosis not present

## 2021-05-01 DIAGNOSIS — R140 Abdominal distension (gaseous): Secondary | ICD-10-CM | POA: Diagnosis not present

## 2021-05-01 DIAGNOSIS — K219 Gastro-esophageal reflux disease without esophagitis: Secondary | ICD-10-CM | POA: Diagnosis not present

## 2021-05-02 DIAGNOSIS — N838 Other noninflammatory disorders of ovary, fallopian tube and broad ligament: Secondary | ICD-10-CM | POA: Diagnosis not present

## 2021-05-02 DIAGNOSIS — R160 Hepatomegaly, not elsewhere classified: Secondary | ICD-10-CM | POA: Diagnosis not present

## 2021-05-05 DIAGNOSIS — N83209 Unspecified ovarian cyst, unspecified side: Secondary | ICD-10-CM | POA: Diagnosis not present

## 2021-05-05 DIAGNOSIS — N83201 Unspecified ovarian cyst, right side: Secondary | ICD-10-CM | POA: Diagnosis not present

## 2021-05-05 DIAGNOSIS — N83202 Unspecified ovarian cyst, left side: Secondary | ICD-10-CM | POA: Diagnosis not present

## 2021-05-14 DIAGNOSIS — R160 Hepatomegaly, not elsewhere classified: Secondary | ICD-10-CM | POA: Diagnosis not present

## 2021-05-14 DIAGNOSIS — K7689 Other specified diseases of liver: Secondary | ICD-10-CM | POA: Diagnosis not present

## 2021-05-18 DIAGNOSIS — J22 Unspecified acute lower respiratory infection: Secondary | ICD-10-CM | POA: Diagnosis not present

## 2021-05-18 DIAGNOSIS — R6889 Other general symptoms and signs: Secondary | ICD-10-CM | POA: Diagnosis not present

## 2021-05-18 DIAGNOSIS — R051 Acute cough: Secondary | ICD-10-CM | POA: Diagnosis not present

## 2021-05-18 DIAGNOSIS — J019 Acute sinusitis, unspecified: Secondary | ICD-10-CM | POA: Diagnosis not present

## 2021-05-28 DIAGNOSIS — G43909 Migraine, unspecified, not intractable, without status migrainosus: Secondary | ICD-10-CM | POA: Diagnosis not present

## 2021-05-28 DIAGNOSIS — F419 Anxiety disorder, unspecified: Secondary | ICD-10-CM | POA: Diagnosis not present

## 2021-05-28 DIAGNOSIS — R739 Hyperglycemia, unspecified: Secondary | ICD-10-CM | POA: Diagnosis not present

## 2021-05-28 DIAGNOSIS — E669 Obesity, unspecified: Secondary | ICD-10-CM | POA: Diagnosis not present

## 2021-06-12 ENCOUNTER — Encounter: Payer: Self-pay | Admitting: Internal Medicine

## 2021-07-02 ENCOUNTER — Ambulatory Visit: Payer: BC Managed Care – PPO | Admitting: Internal Medicine

## 2021-07-03 DIAGNOSIS — J069 Acute upper respiratory infection, unspecified: Secondary | ICD-10-CM | POA: Diagnosis not present

## 2021-07-03 DIAGNOSIS — R519 Headache, unspecified: Secondary | ICD-10-CM | POA: Diagnosis not present

## 2021-07-05 DIAGNOSIS — J019 Acute sinusitis, unspecified: Secondary | ICD-10-CM | POA: Diagnosis not present

## 2021-07-05 DIAGNOSIS — G43009 Migraine without aura, not intractable, without status migrainosus: Secondary | ICD-10-CM | POA: Diagnosis not present

## 2021-07-05 DIAGNOSIS — Z1159 Encounter for screening for other viral diseases: Secondary | ICD-10-CM | POA: Diagnosis not present

## 2021-07-07 DIAGNOSIS — N83209 Unspecified ovarian cyst, unspecified side: Secondary | ICD-10-CM | POA: Diagnosis not present

## 2021-07-07 DIAGNOSIS — N83202 Unspecified ovarian cyst, left side: Secondary | ICD-10-CM | POA: Diagnosis not present

## 2021-07-07 DIAGNOSIS — Z01419 Encounter for gynecological examination (general) (routine) without abnormal findings: Secondary | ICD-10-CM | POA: Diagnosis not present

## 2021-07-07 DIAGNOSIS — N83201 Unspecified ovarian cyst, right side: Secondary | ICD-10-CM | POA: Diagnosis not present

## 2021-07-07 DIAGNOSIS — Z6828 Body mass index (BMI) 28.0-28.9, adult: Secondary | ICD-10-CM | POA: Diagnosis not present

## 2021-07-10 DIAGNOSIS — Z2821 Immunization not carried out because of patient refusal: Secondary | ICD-10-CM | POA: Diagnosis not present

## 2021-07-10 DIAGNOSIS — R739 Hyperglycemia, unspecified: Secondary | ICD-10-CM | POA: Diagnosis not present

## 2021-07-10 DIAGNOSIS — Z87891 Personal history of nicotine dependence: Secondary | ICD-10-CM | POA: Diagnosis not present

## 2021-07-10 DIAGNOSIS — G43909 Migraine, unspecified, not intractable, without status migrainosus: Secondary | ICD-10-CM | POA: Diagnosis not present

## 2021-07-27 DIAGNOSIS — I6529 Occlusion and stenosis of unspecified carotid artery: Secondary | ICD-10-CM

## 2021-07-27 HISTORY — DX: Occlusion and stenosis of unspecified carotid artery: I65.29

## 2021-08-06 ENCOUNTER — Other Ambulatory Visit (INDEPENDENT_AMBULATORY_CARE_PROVIDER_SITE_OTHER): Payer: BC Managed Care – PPO

## 2021-08-06 ENCOUNTER — Encounter: Payer: Self-pay | Admitting: Internal Medicine

## 2021-08-06 ENCOUNTER — Ambulatory Visit (INDEPENDENT_AMBULATORY_CARE_PROVIDER_SITE_OTHER): Payer: BC Managed Care – PPO | Admitting: Internal Medicine

## 2021-08-06 VITALS — BP 100/60 | HR 84 | Ht 62.0 in | Wt 152.2 lb

## 2021-08-06 DIAGNOSIS — R112 Nausea with vomiting, unspecified: Secondary | ICD-10-CM | POA: Diagnosis not present

## 2021-08-06 DIAGNOSIS — R1013 Epigastric pain: Secondary | ICD-10-CM | POA: Diagnosis not present

## 2021-08-06 DIAGNOSIS — K219 Gastro-esophageal reflux disease without esophagitis: Secondary | ICD-10-CM

## 2021-08-06 DIAGNOSIS — R140 Abdominal distension (gaseous): Secondary | ICD-10-CM

## 2021-08-06 DIAGNOSIS — R198 Other specified symptoms and signs involving the digestive system and abdomen: Secondary | ICD-10-CM

## 2021-08-06 LAB — LIPASE: Lipase: 35 U/L (ref 11.0–59.0)

## 2021-08-06 LAB — CBC WITH DIFFERENTIAL/PLATELET
Basophils Absolute: 0.1 10*3/uL (ref 0.0–0.1)
Basophils Relative: 0.6 % (ref 0.0–3.0)
Eosinophils Absolute: 0.2 10*3/uL (ref 0.0–0.7)
Eosinophils Relative: 1.8 % (ref 0.0–5.0)
HCT: 39.6 % (ref 36.0–46.0)
Hemoglobin: 13.3 g/dL (ref 12.0–15.0)
Lymphocytes Relative: 23 % (ref 12.0–46.0)
Lymphs Abs: 2.7 10*3/uL (ref 0.7–4.0)
MCHC: 33.6 g/dL (ref 30.0–36.0)
MCV: 93.8 fl (ref 78.0–100.0)
Monocytes Absolute: 0.5 10*3/uL (ref 0.1–1.0)
Monocytes Relative: 4.6 % (ref 3.0–12.0)
Neutro Abs: 8.1 10*3/uL — ABNORMAL HIGH (ref 1.4–7.7)
Neutrophils Relative %: 70 % (ref 43.0–77.0)
Platelets: 239 10*3/uL (ref 150.0–400.0)
RBC: 4.22 Mil/uL (ref 3.87–5.11)
RDW: 13 % (ref 11.5–15.5)
WBC: 11.6 10*3/uL — ABNORMAL HIGH (ref 4.0–10.5)

## 2021-08-06 LAB — TSH: TSH: 1.15 u[IU]/mL (ref 0.35–5.50)

## 2021-08-06 LAB — COMPREHENSIVE METABOLIC PANEL
ALT: 12 U/L (ref 0–35)
AST: 12 U/L (ref 0–37)
Albumin: 3.9 g/dL (ref 3.5–5.2)
Alkaline Phosphatase: 68 U/L (ref 39–117)
BUN: 9 mg/dL (ref 6–23)
CO2: 28 mEq/L (ref 19–32)
Calcium: 8.9 mg/dL (ref 8.4–10.5)
Chloride: 105 mEq/L (ref 96–112)
Creatinine, Ser: 1.04 mg/dL (ref 0.40–1.20)
GFR: 67.57 mL/min (ref >60.00)
Glucose, Bld: 78 mg/dL (ref 70–99)
Potassium: 3.2 mEq/L — ABNORMAL LOW (ref 3.5–5.1)
Sodium: 140 mEq/L (ref 135–145)
Total Bilirubin: 0.3 mg/dL (ref 0.2–1.2)
Total Protein: 6.8 g/dL (ref 6.0–8.3)

## 2021-08-06 LAB — CORTISOL: Cortisol, Plasma: 4.8 ug/dL

## 2021-08-06 MED ORDER — OMEPRAZOLE 40 MG PO CPDR: 40.0000 mg | DELAYED_RELEASE_CAPSULE | Freq: Every day | ORAL | 3 refills | Status: DC

## 2021-08-06 NOTE — Progress Notes (Addendum)
Chief Complaint: Abdominal pain  HPI : 41 year old female with history of bipolar disorder, depression, rheumatoid arthritis, and IBS-D presents with ab pain  She has had issues with abdominal bloating ever since her hysterectomy in 2020.  Her bloating has sometimes cause her to become short of breath because it compresses her chest.  Denies lightheadedness. She has been doing weight loss injections starting in 05/2021. She is still able to eat and drink but has a decreased appetite due to the weight loss injections. She always has had issues with N&V due to her migraines. Has also had some issues with diarrhea especially around the time of her menstrual period. Now she has alternating issues with constipation and diarrhea ever since her hysterectomy. Endorses some issues with ab pain, mostly in the upper abdomen. Endorses chest burning and regurgitation for which she takes Zantac. No prior EGD or colonoscopy. Denies blood in stools. Denies fam hx of colon cancer. Denies dysphagia, odynophagia.  Her blood pressure can run low on occasion.   Past Medical History:  Diagnosis Date   Abnormal Pap smear    Acute renal failure syndrome (HCC) 09/18/2020   Atopic dermatitis 09/18/2020   Atypical chest pain 06/05/2019   Bipolar affective disorder, currently depressed, moderate (HCC) 09/18/2020   Bite of nonvenomous arthropod 09/18/2020   Compulsive behavior 09/18/2020   Depression    Dyspnea on exertion 03/31/2019   Essential hypertension 03/31/2019   Generalized anxiety disorder 09/18/2020   Hypertension    Insomnia 03/31/2019   Irregular menstruation 09/18/2020   Irritable bowel syndrome with diarrhea 09/18/2020   Major depression, single episode 09/18/2020   Migraines    Mixed hyperlipidemia 09/18/2020   Osteoporosis 09/18/2020   Other long term (current) drug therapy 09/18/2020   Palpitations 03/31/2019   Pneumonia    Psoriasis 09/18/2020   RA (rheumatoid arthritis) (HCC)    Raynaud's  disease    Refractory migraine with aura 09/18/2020   Rheumatoid arthritis (HCC) 03/31/2019   Rocky Mountain spotted fever 01/17/2019   Ruptured abdominal aortic aneurysm 09/18/2020   Seizures (HCC)    for tking medication Viibryd   Tachycardia 09/18/2020   Vitamin D deficiency 09/18/2020     Past Surgical History:  Procedure Laterality Date   ANKLE SURGERY Right    BREAST ENHANCEMENT SURGERY  2015   IVF  2012   TUBAL LIGATION     VAGINAL HYSTERECTOMY     still have ovaries   WISDOM TOOTH EXTRACTION     Family History  Problem Relation Age of Onset   Diabetes Mother    Thyroid disease Mother    Heart disease Mother    Hypertension Father    Prostate cancer Father    Hyperlipidemia Father    Anxiety disorder Sister    Irritable bowel syndrome Sister    Diabetes Maternal Grandmother    Hypertension Maternal Grandmother    Kidney disease Maternal Grandmother    Heart disease Maternal Grandmother    Liver disease Maternal Grandmother    Diabetes Maternal Aunt    Diabetes Maternal Uncle    Social History   Tobacco Use   Smoking status: Every Day   Smokeless tobacco: Never  Vaping Use   Vaping Use: Never used  Substance Use Topics   Alcohol use: No   Drug use: No   Current Outpatient Medications  Medication Sig Dispense Refill   cloNIDine (CATAPRES) 0.1 MG tablet Take 1 tablet (0.1 mg total) by mouth 2 (two) times daily.  180 tablet 1   Erenumab-aooe 70 MG/ML SOAJ Inject 140 mg into the skin every 30 (thirty) days.     ibuprofen (ADVIL) 800 MG tablet Take 800 mg by mouth every 8 (eight) hours as needed.     NURTEC 75 MG TBDP Take 1 tablet by mouth at bedtime.     omeprazole (PRILOSEC) 40 MG capsule Take 1 capsule (40 mg total) by mouth daily. 90 capsule 3   promethazine (PHENERGAN) 25 MG tablet Take 1 tablet by mouth every 12 (twelve) hours as needed for nausea.     propranolol (INDERAL) 40 MG tablet TAKE ONE TABLET BY MOUTH TWICE DAILY 180 tablet 1   tirzepatide  (MOUNJARO) 2.5 MG/0.5ML Pen Inject 2.5 mg into the skin once a week.     tiZANidine (ZANAFLEX) 4 MG tablet Take 4 mg by mouth 2 (two) times daily as needed for spasms.     traZODone (DESYREL) 150 MG tablet Take 150 mg by mouth daily.     No current facility-administered medications for this visit.   Allergies  Allergen Reactions   Baclofen Other (See Comments)    Other reaction(s): Hallucinations, Other Other reaction(s): Hallucinations Other reaction(s): Hallucinations, Other   Humira [Adalimumab] Hives and Other (See Comments)    Acute renal failure Other reaction(s): Other Acute Renal Failure    Prednisone Other (See Comments)    Brittle  bones    Hydromorphone     Other reaction(s): Headache   Sumatriptan Other (See Comments)   Topiramate Other (See Comments)   Topiramate Er Other (See Comments)   Vilazodone Other (See Comments)    seizures Other reaction(s): Other (See Comments), Seizure seizures seizures   Vilazodone Hcl Other (See Comments)   Amitriptyline Hcl Rash     Review of Systems: All systems reviewed and negative except where noted in HPI.   Physical Exam: BP 100/60 (BP Location: Left Arm, Patient Position: Sitting, Cuff Size: Normal)    Pulse 84    Ht 5\' 2"  (1.575 m)    Wt 152 lb 4 oz (69.1 kg)    LMP 10/29/2016 (Exact Date)    Breastfeeding No    BMI 27.85 kg/m  Constitutional: Pleasant,well-developed, female in no acute distress. HEENT: Normocephalic and atraumatic. Conjunctivae are normal. No scleral icterus. Cardiovascular: Normal rate, regular rhythm.  Pulmonary/chest: Effort normal and breath sounds normal. No wheezing, rales or rhonchi. Abdominal: Soft, nondistended, mildly tender over the left upper quadrant. Bowel sounds active throughout. There are no masses palpable. No hepatomegaly. Extremities: No edema Neurological: Alert and oriented to person place and time. Skin: Skin is warm and dry. No rashes noted. Psychiatric: Normal mood and  affect. Behavior is normal.  Labs 07/2019: CMP with mildly decreased albumin of 3.6.  CBC unremarkable  Labs 12/2020: CMP unremarkable  TTE 10/19/19: IMPRESSIONS   1. Left ventricular ejection fraction, by estimation, is 60 to 65%. The  left ventricle has normal function. The left ventricle has no regional  wall motion abnormalities. Left ventricular diastolic parameters were  normal.   2. Both TAPSE and S are abnormal and systolic function is poorly  visualized on 4C and Will views.. Right ventricular systolic function was  not well visualized. The right ventricular size is normal. There is normal  pulmonary artery systolic pressure.   3. The mitral valve is normal in structure. No evidence of mitral valve  regurgitation. No evidence of mitral stenosis.   4. The aortic valve is tricuspid. Aortic valve regurgitation is not  visualized.  No aortic stenosis is present.   5. The inferior vena cava is normal in size with greater than 50%  respiratory variability, suggesting right atrial pressure of 3 mmHg.   ASSESSMENT AND PLAN: GERD N&V Epigastric abdominal discomfort Alternating constipation diarrhea Abdominal bloating Patient presents with several GI complaints in clinic today.  She has been dealing with issues with epigastric abdominal discomfort, abdominal bloating, alternating bowel habits, and nausea and vomiting.  She has recently had abdominal imaging, but none are available for review in our system.  We will attempt to get some of these abdominal imaging reports, check for basic etiologies of nausea and vomiting, and start her on fiber and omeprazole to see if these will help with her symptoms.  We will also plan for EGD for further evaluation to see if she has PUD, gastritis/duodenitis, or esophagitis. - Check CBC, CMP, TSH, cortisol, lipase - Obtain abdominal MRI from the MRI center in [place] and CT from [facility] - Start fiber supplements - Start omeprazole 40 mg QD -  Plan for EGD [facility] - RTC 3 months  Eulah Pont, MD  I spent 65 minutes of time, including in depth chart review, independent review of results as outlined above, communicating results with the patient directly, face-to-face time with the patient, coordinating care, ordering studies and medications as appropriate, and documentation.    ADDENDUM: Received records from [facility]. MRI Abd 05/14/21 showed enhancing 9 mm lesion in the dome of the liver with characteristics most consistent with benign hepatic hemangioma.

## 2021-08-06 NOTE — Patient Instructions (Signed)
If you are age 40 or older, your body mass index should be between 23-30. Your Body mass index is 27.85 kg/m. If this is out of the aforementioned range listed, please consider follow up with your Primary Care Provider.  If you are age 40 or younger, your body mass index should be between 19-25. Your Body mass index is 27.85 kg/m. If this is out of the aformentioned range listed, please consider follow up with your Primary Care Provider.   Your provider has requested that you go to the basement level for lab work before leaving today. Press "B" on the elevator. The lab is located at the first door on the left as you exit the elevator.  We have sent the following medications to your pharmacy for you to pick up at your convenience: Omeprazole 40 mg daily 30 minutes before meals   Start daily Fiber Supplement.  You have been scheduled for an endoscopy. Please follow written instructions given to you at your visit today. If you use inhalers (even only as needed), please bring them with you on the day of your procedure.  The [facility] providers would like to encourage you to use [facility] to communicate with providers for non-urgent requests or questions.  Due to long hold times on the telephone, sending your provider a message by [facility] may be a faster and more efficient way to get a response.  Please allow 48 business hours for a response.  Please remember that this is for non-urgent requests.   It was a pleasure to see you today!  Thank you for trusting me with your gastrointestinal care!    Norwood LevoYing Dorsey, MD

## 2021-08-08 ENCOUNTER — Other Ambulatory Visit: Payer: Self-pay

## 2021-08-08 DIAGNOSIS — D72829 Elevated white blood cell count, unspecified: Secondary | ICD-10-CM

## 2021-08-08 DIAGNOSIS — R112 Nausea with vomiting, unspecified: Secondary | ICD-10-CM

## 2021-08-08 DIAGNOSIS — R109 Unspecified abdominal pain: Secondary | ICD-10-CM

## 2021-08-11 ENCOUNTER — Ambulatory Visit (INDEPENDENT_AMBULATORY_CARE_PROVIDER_SITE_OTHER)
Admission: RE | Admit: 2021-08-11 | Discharge: 2021-08-11 | Disposition: A | Discharge status: Home / Self Care | Payer: BC Managed Care – PPO | Source: Ambulatory Visit | Attending: Internal Medicine | Admitting: Internal Medicine

## 2021-08-11 ENCOUNTER — Other Ambulatory Visit (INDEPENDENT_AMBULATORY_CARE_PROVIDER_SITE_OTHER): Payer: BC Managed Care – PPO

## 2021-08-11 ENCOUNTER — Other Ambulatory Visit: Payer: Self-pay

## 2021-08-11 DIAGNOSIS — R109 Unspecified abdominal pain: Secondary | ICD-10-CM

## 2021-08-11 DIAGNOSIS — D72829 Elevated white blood cell count, unspecified: Secondary | ICD-10-CM

## 2021-08-11 DIAGNOSIS — R112 Nausea with vomiting, unspecified: Secondary | ICD-10-CM

## 2021-08-11 LAB — URINALYSIS, ROUTINE W REFLEX MICROSCOPIC
Bilirubin Urine: NEGATIVE
Hgb urine dipstick: NEGATIVE
Leukocytes,Ua: NEGATIVE
Nitrite: NEGATIVE
RBC / HPF: NONE SEEN (ref 0–?)
Specific Gravity, Urine: 1.01 (ref 1.000–1.030)
Total Protein, Urine: NEGATIVE
Urine Glucose: NEGATIVE
Urobilinogen, UA: 0.2 (ref 0.0–1.0)
pH: 6.5 (ref 5.0–8.0)

## 2021-08-13 ENCOUNTER — Encounter: Payer: Self-pay | Admitting: Internal Medicine

## 2021-08-18 ENCOUNTER — Telehealth: Payer: Self-pay | Admitting: Cardiology

## 2021-08-18 NOTE — Telephone Encounter (Signed)
Patient is scheduled for an endoscopy in te morning, 01/24. States after completing her paperwork for endoscopy in MyChart she reviewed Dr. Vanetta ShawlKrasowski's OV notes from 2022. States she saw a Ruptured AAA dx and she has concerns because she was not made aware of this. She would like to know if this was a mistake. Please advise.

## 2021-08-19 ENCOUNTER — Other Ambulatory Visit: Payer: Self-pay

## 2021-08-19 ENCOUNTER — Encounter: Payer: Self-pay | Admitting: Internal Medicine

## 2021-08-19 ENCOUNTER — Ambulatory Visit (AMBULATORY_SURGERY_CENTER): Payer: BC Managed Care – PPO | Admitting: Internal Medicine

## 2021-08-19 VITALS — BP 151/103 | HR 90 | Temp 96.0°F | Resp 15 | Ht 62.0 in | Wt 152.0 lb

## 2021-08-19 DIAGNOSIS — D72829 Elevated white blood cell count, unspecified: Secondary | ICD-10-CM

## 2021-08-19 DIAGNOSIS — R109 Unspecified abdominal pain: Secondary | ICD-10-CM

## 2021-08-19 DIAGNOSIS — K2970 Gastritis, unspecified, without bleeding: Secondary | ICD-10-CM | POA: Diagnosis not present

## 2021-08-19 DIAGNOSIS — R112 Nausea with vomiting, unspecified: Secondary | ICD-10-CM

## 2021-08-19 DIAGNOSIS — K2950 Unspecified chronic gastritis without bleeding: Secondary | ICD-10-CM | POA: Diagnosis not present

## 2021-08-19 DIAGNOSIS — K219 Gastro-esophageal reflux disease without esophagitis: Secondary | ICD-10-CM

## 2021-08-19 DIAGNOSIS — K229 Disease of esophagus, unspecified: Secondary | ICD-10-CM | POA: Diagnosis not present

## 2021-08-19 DIAGNOSIS — R1013 Epigastric pain: Secondary | ICD-10-CM

## 2021-08-19 DIAGNOSIS — R140 Abdominal distension (gaseous): Secondary | ICD-10-CM | POA: Diagnosis not present

## 2021-08-19 DIAGNOSIS — R198 Other specified symptoms and signs involving the digestive system and abdomen: Secondary | ICD-10-CM | POA: Diagnosis not present

## 2021-08-19 MED ORDER — OMEPRAZOLE 40 MG PO CPDR: 40.0000 mg | DELAYED_RELEASE_CAPSULE | Freq: Two times a day (BID) | ORAL | 3 refills | Status: DC

## 2021-08-19 MED ORDER — SODIUM CHLORIDE 0.9 % IV SOLN: 500.0000 mL | Freq: Once | INTRAVENOUS | Status: DC

## 2021-08-19 NOTE — Progress Notes (Signed)
Called to room to assist during endoscopic procedure.  Patient ID and intended procedure confirmed with present staff. Received instructions for my participation in the procedure from the performing physician.  

## 2021-08-19 NOTE — Telephone Encounter (Signed)
Looks like this was an error of dx Ruptured AAA, unsure how it was recorded on the patient chart. I have corrected this and LM for the patient to return my call.

## 2021-08-19 NOTE — Progress Notes (Signed)
Pt's states no medical or surgical changes since previsit or office visit.  Vitals DT 

## 2021-08-19 NOTE — Progress Notes (Signed)
GASTROENTEROLOGY PROCEDURE H&P NOTE   Primary Care Physician: Renaldo Reel, PA    Reason for Procedure:   Epigastric ab pain  Plan:    EGD  Patient is appropriate for endoscopic procedure(s) in the ambulatory ([place]) setting.  The nature of the procedure, as well as the risks, benefits, and alternatives were carefully and thoroughly reviewed with the patient. Ample time for discussion and questions allowed. The patient understood, was satisfied, and agreed to proceed.     HPI: Mary Dean is a 41 y.o. female who presents for EGD for evaluation of epigastric ab pain .  Patient was most recently seen in the Gastroenterology Clinic on 08/19/21.  No interval change in medical history since that appointment. Please refer to that note for full details regarding GI history and clinical presentation.   Past Medical History:  Diagnosis Date   Abnormal Pap smear    Acute renal failure syndrome ([place]) 09/18/2020   Atopic dermatitis 09/18/2020   Atypical chest pain 06/05/2019   Bipolar affective disorder, currently depressed, moderate ([place]) 09/18/2020   Bite of nonvenomous arthropod 09/18/2020   Compulsive behavior 09/18/2020   Depression    Dyspnea on exertion 03/31/2019   Essential hypertension 03/31/2019   Generalized anxiety disorder 09/18/2020   Hypertension    Insomnia 03/31/2019   Irregular menstruation 09/18/2020   Irritable bowel syndrome with diarrhea 09/18/2020   Major depression, single episode 09/18/2020   Migraines    Mixed hyperlipidemia 09/18/2020   Osteoporosis 09/18/2020   Other long term (current) drug therapy 09/18/2020   Palpitations 03/31/2019   Pneumonia    Psoriasis 09/18/2020   RA (rheumatoid arthritis) ([place])    Raynaud's disease    Refractory migraine with aura 09/18/2020   Rheumatoid arthritis ([place]) 03/31/2019   [place] spotted fever 01/17/2019   Ruptured abdominal aortic aneurysm 09/18/2020   Seizures ([place])    for tking medication  Viibryd   Tachycardia 09/18/2020   Vitamin D deficiency 09/18/2020    Past Surgical History:  Procedure Laterality Date   ANKLE SURGERY Right    BREAST ENHANCEMENT SURGERY  2015   IVF  2012   TUBAL LIGATION     UPPER GASTROINTESTINAL ENDOSCOPY     VAGINAL HYSTERECTOMY     still have ovaries   WISDOM TOOTH EXTRACTION      Prior to Admission medications   Medication Sig Start Date End Date Taking? Authorizing Provider  cloNIDine (CATAPRES) 0.1 MG tablet Take 1 tablet (0.1 mg total) by mouth 2 (two) times daily. 05/17/20  Yes Park Liter, MD  omeprazole (PRILOSEC) 40 MG capsule Take 1 capsule (40 mg total) by mouth daily. 08/06/21  Yes Sharyn Creamer, MD  propranolol (INDERAL) 40 MG tablet TAKE ONE TABLET BY MOUTH TWICE DAILY 02/04/21  Yes Park Liter, MD  tirzepatide [facility]) 2.5 MG/0.5ML Pen Inject 2.5 mg into the skin once a week.   Yes [provider]  tiZANidine (ZANAFLEX) 4 MG tablet Take 4 mg by mouth 2 (two) times daily as needed for spasms. 10/15/16  Yes [provider]  traZODone (DESYREL) 150 MG tablet Take 150 mg by mouth daily. 05/28/20  Yes [provider]  Erenumab-aooe 70 MG/ML SOAJ Inject 140 mg into the skin every 30 (thirty) days. 05/04/17   [provider]  ibuprofen (ADVIL) 800 MG tablet Take 800 mg by mouth every 8 (eight) hours as needed. 09/20/20   [provider]  NURTEC 75 MG TBDP Take 1 tablet  by mouth at bedtime. 09/25/20   [provider]  promethazine (PHENERGAN) 25 MG tablet Take 1 tablet by mouth every 12 (twelve) hours as needed for nausea. 09/28/19   [provider]    Current Outpatient Medications  Medication Sig Dispense Refill   cloNIDine (CATAPRES) 0.1 MG tablet Take 1 tablet (0.1 mg total) by mouth 2 (two) times daily. 180 tablet 1   omeprazole (PRILOSEC) 40 MG capsule Take 1 capsule (40 mg total) by mouth daily. 90 capsule 3   propranolol (INDERAL) 40 MG tablet TAKE ONE  TABLET BY MOUTH TWICE DAILY 180 tablet 1   tirzepatide (MOUNJARO) 2.5 MG/0.5ML Pen Inject 2.5 mg into the skin once a week.     tiZANidine (ZANAFLEX) 4 MG tablet Take 4 mg by mouth 2 (two) times daily as needed for spasms.     traZODone (DESYREL) 150 MG tablet Take 150 mg by mouth daily.     Erenumab-aooe 70 MG/ML SOAJ Inject 140 mg into the skin every 30 (thirty) days.     ibuprofen (ADVIL) 800 MG tablet Take 800 mg by mouth every 8 (eight) hours as needed.     NURTEC 75 MG TBDP Take 1 tablet by mouth at bedtime.     promethazine (PHENERGAN) 25 MG tablet Take 1 tablet by mouth every 12 (twelve) hours as needed for nausea.     Current Facility-Administered Medications  Medication Dose Route Frequency Provider Last Rate Last Admin   0.9 %  sodium chloride infusion  500 mL Intravenous Once Sharyn Creamer, MD        Allergies as of 08/19/2021 - Review Complete 08/19/2021  Allergen Reaction Noted   Baclofen Other (See Comments) 06/23/2018   Humira [adalimumab] Hives and Other (See Comments) 10/29/2016   Prednisone Other (See Comments) 07/14/2020   Sumatriptan Other (See Comments) 02/06/2019   Topiramate Other (See Comments) 02/06/2019   Topiramate er Other (See Comments) 02/06/2019   Vilazodone Other (See Comments) 01/17/2019   Vilazodone hcl Other (See Comments) 02/06/2019   Amitriptyline hcl Rash 02/06/2019    Family History  Problem Relation Age of Onset   Diabetes Mother    Thyroid disease Mother    Heart disease Mother    Hypertension Father    Prostate cancer Father    Hyperlipidemia Father    Anxiety disorder Sister    Irritable bowel syndrome Sister    Diabetes Maternal Aunt    Diabetes Maternal Uncle    Diabetes Maternal Grandmother    Hypertension Maternal Grandmother    Kidney disease Maternal Grandmother    Heart disease Maternal Grandmother    Liver disease Maternal Grandmother    Colon cancer Neg Hx    Esophageal cancer Neg Hx    Rectal cancer Neg Hx     Stomach cancer Neg Hx     Social History   Socioeconomic History   Marital status: Married    Spouse name: Mary Dean   Number of children: 2   Years of education: Not on file   Highest education level: Associate degree: academic program  Occupational History   Occupation: unemployed  Tobacco Use   Smoking status: Every Day    Packs/day: 0.50    Types: Cigarettes   Smokeless tobacco: Never   Tobacco comments:    Started smoking age 7   Vaping Use   Vaping Use: Never used  Substance and Sexual Activity   Alcohol use: Never   Drug use: Never   Sexual activity: Yes  Other Topics Concern   Not on file  Social History Narrative   Patient is right-handed. She lives with her husband in a 2 level home, master on 1st. She drinks two to three 12 oz glasses of tea and same amount of of Dr. Malachi Bonds sodas. She does not exercise.   Social Determinants of Health   Financial Resource Strain: Not on file  Food Insecurity: Not on file  Transportation Needs: Not on file  Physical Activity: Not on file  Stress: Not on file  Social Connections: Not on file  Intimate Partner Violence: Not on file    Physical Exam: Vital signs in last 24 hours: BP 120/88    Temp (!) 96 F (35.6 C) (Temporal)    Ht 5\' 2"  (1.575 m)    Wt 152 lb (68.9 kg)    LMP 10/29/2016 (Exact Date)    SpO2 98%    BMI 27.80 kg/m  GEN: NAD EYE: Sclerae anicteric ENT: MMM CV: Non-tachycardic Pulm: No increased WOB GI: Soft NEURO:  Alert & Oriented   [name], MD [facility]   08/19/2021 11:38 AM

## 2021-08-19 NOTE — Patient Instructions (Signed)
YOU HAD AN ENDOSCOPIC PROCEDURE TODAY AT THE [facility]:   Refer to the procedure report that was given to you for any specific questions about what was found during the examination.  If the procedure report does not answer your questions, please call your gastroenterologist to clarify.  If you requested that your care partner not be given the details of your procedure findings, then the procedure report has been included in a sealed envelope for you to review at your convenience later.  YOU SHOULD EXPECT: Some feelings of bloating in the abdomen. Passage of more gas than usual.  Walking can help get rid of the air that was put into your GI tract during the procedure and reduce the bloating. If you had a lower endoscopy (such as a colonoscopy or flexible sigmoidoscopy) you may notice spotting of blood in your stool or on the toilet paper. If you underwent a bowel prep for your procedure, you may not have a normal bowel movement for a few days.  Please Note:  You might notice some irritation and congestion in your nose or some drainage.  This is from the oxygen used during your procedure.  There is no need for concern and it should clear up in a day or so.  SYMPTOMS TO REPORT IMMEDIATELY:    Following upper endoscopy (EGD)  Vomiting of blood or coffee ground material  New chest pain or pain under the shoulder blades  Painful or persistently difficult swallowing  New shortness of breath  Fever of 100F or higher  Black, tarry-looking stools  For urgent or emergent issues, a gastroenterologist can be reached at any hour by calling [phone]. Do not use MyChart messaging for urgent concerns.    DIET:  We do recommend a small meal at first, but then you may proceed to your regular diet.  Drink plenty of fluids but you should avoid alcoholic beverages for 24 hours.  ACTIVITY:  You should plan to take it easy for the rest of today and you should NOT DRIVE or use heavy machinery  until tomorrow (because of the sedation medicines used during the test).    FOLLOW UP: Our staff will call the number listed on your records 48-72 hours following your procedure to check on you and address any questions or concerns that you may have regarding the information given to you following your procedure. If we do not reach you, we will leave a message.  We will attempt to reach you two times.  During this call, we will ask if you have developed any symptoms of COVID 19. If you develop any symptoms (ie: fever, flu-like symptoms, shortness of breath, cough etc.) before then, please call [phone].  If you test positive for Covid 19 in the 2 weeks post procedure, please call and report this information to us.    If any biopsies were taken you will be contacted by phone or by letter within the next 1-3 weeks.  Please call us at [phone] if you have not heard about the biopsies in 3 weeks.    SIGNATURES/CONFIDENTIALITY: You and/or your care partner have signed paperwork which will be entered into your electronic medical record.  These signatures attest to the fact that that the information above on your After Visit Summary has been reviewed and is understood.  Full responsibility of the confidentiality of this discharge information lies with you and/or your care-partner. 

## 2021-08-19 NOTE — Op Note (Signed)
[place] Patient Name: Mary Dean Procedure Date: 08/19/2021 11:37 AM MRN: [id number] Endoscopist: Sonny Masters "Mary Dean ,  Age: 41 Referring MD:  Date of Birth: 12/01/1980 Gender: Female Account #: [id number] Procedure:                Upper GI endoscopy Indications:              Epigastric abdominal pain Medicines:                Monitored Anesthesia Care Procedure:                Pre-Anesthesia Assessment:                           - Prior to the procedure, a History and Physical                            was performed, and patient medications and                            allergies were reviewed. The patient's tolerance of                            previous anesthesia was also reviewed. The risks                            and benefits of the procedure and the sedation                            options and risks were discussed with the patient.                            All questions were answered, and informed consent                            was obtained. Prior Anticoagulants: The patient has                            taken no previous anticoagulant or antiplatelet                            agents. ASA Grade Assessment: II - A patient with                            mild systemic disease. After reviewing the risks                            and benefits, the patient was deemed in                            satisfactory condition to undergo the procedure.                           After obtaining informed consent, the endoscope was  passed under direct vision. Throughout the                            procedure, the patient's blood pressure, pulse, and                            oxygen saturations were monitored continuously. The                            GIF [id number] was introduced through the                            mouth, and advanced to the second part of duodenum.                            The upper GI endoscopy was  accomplished without                            difficulty. The patient tolerated the procedure                            well. Scope In: Scope Out: Findings:                 The examined esophagus was normal. Biopsies were                            taken with a cold forceps for histology.                           Localized mild inflammation characterized by                            congestion (edema) and erythema was found in the                            gastric body. Biopsies were taken with a cold                            forceps for histology.                           The examined duodenum was normal. Biopsies were                            taken with a cold forceps for histology. Complications:            No immediate complications. Estimated Blood Loss:     Estimated blood loss was minimal. Impression:               - Normal esophagus. Biopsied.                           - Gastritis. Biopsied.                           -  Normal examined duodenum. Biopsied. Recommendation:           - Discharge patient to home (with escort).                           - Some of your abdominal discomfort may be related                            to inflammation in the stomach.                           - Use Prilosec (omeprazole) 40 mg PO BID for 8                            weeks.                           - Await pathology results.                           - Return to GI clinic in 2 months.                           - The findings and recommendations were discussed                            with the patient. Sonny Masters "Mary Dean,  08/19/2021 12:03:27 PM

## 2021-08-19 NOTE — Progress Notes (Signed)
Pt in recovery with monitors in place, VSS. Report given to receiving RN. Bite guard was placed with pt awake to ensure comfort. No dental or soft tissue damage noted. 

## 2021-08-21 ENCOUNTER — Telehealth: Payer: Self-pay

## 2021-08-21 ENCOUNTER — Encounter: Payer: Self-pay | Admitting: Internal Medicine

## 2021-08-21 NOTE — Telephone Encounter (Signed)
°  Follow up Call-  Call back number 08/19/2021  Post procedure Call Back phone  # [phone]  Permission to leave phone message Yes  Some recent data might be hidden     Patient questions:  Do you have a fever, pain , or abdominal swelling? No. Pain Score  0 *  Have you tolerated food without any problems? Yes.    Have you been able to return to your normal activities? Yes.    Do you have any questions about your discharge instructions: Diet   No. Medications  No. Follow up visit  No.  Do you have questions or concerns about your Care? No.  Actions: * If pain score is 4 or above: No action needed, pain <4.  Have you developed a fever since your procedure? no  2.   Have you had an respiratory symptoms (SOB or cough) since your procedure? no  3.   Have you tested positive for COVID 19 since your procedure no  4.   Have you had any family members/close contacts diagnosed with the COVID 19 since your procedure?  no   If yes to any of these questions please route to Joylene John, RN and Joella Prince, RN

## 2021-09-04 DIAGNOSIS — R5381 Other malaise: Secondary | ICD-10-CM | POA: Diagnosis not present

## 2021-09-04 DIAGNOSIS — E860 Dehydration: Secondary | ICD-10-CM | POA: Diagnosis not present

## 2021-09-08 DIAGNOSIS — M2550 Pain in unspecified joint: Secondary | ICD-10-CM | POA: Diagnosis not present

## 2021-09-26 ENCOUNTER — Telehealth: Payer: Self-pay | Admitting: Internal Medicine

## 2021-09-26 ENCOUNTER — Other Ambulatory Visit: Payer: Self-pay

## 2021-09-26 MED ORDER — OMEPRAZOLE 40 MG PO CPDR: 40.0000 mg | DELAYED_RELEASE_CAPSULE | Freq: Two times a day (BID) | ORAL | 3 refills | Status: DC

## 2021-09-26 NOTE — Telephone Encounter (Signed)
Inbound call from patient states pharmacy never received omeprazole medication for 2x a day. Would like prescription sent to [facility] in [place] ?

## 2021-09-26 NOTE — Telephone Encounter (Signed)
Medication sent to [facility]. ?

## 2021-10-02 DIAGNOSIS — F419 Anxiety disorder, unspecified: Secondary | ICD-10-CM | POA: Diagnosis not present

## 2021-10-02 DIAGNOSIS — G43909 Migraine, unspecified, not intractable, without status migrainosus: Secondary | ICD-10-CM | POA: Diagnosis not present

## 2021-10-02 DIAGNOSIS — M069 Rheumatoid arthritis, unspecified: Secondary | ICD-10-CM | POA: Diagnosis not present

## 2021-10-02 DIAGNOSIS — I10 Essential (primary) hypertension: Secondary | ICD-10-CM | POA: Diagnosis not present

## 2021-10-20 ENCOUNTER — Ambulatory Visit: Payer: BC Managed Care – PPO | Admitting: Internal Medicine

## 2021-10-28 ENCOUNTER — Ambulatory Visit (INDEPENDENT_AMBULATORY_CARE_PROVIDER_SITE_OTHER): Payer: BC Managed Care – PPO | Admitting: Internal Medicine

## 2021-10-28 ENCOUNTER — Encounter: Payer: Self-pay | Admitting: Internal Medicine

## 2021-10-28 VITALS — BP 130/70 | HR 90 | Ht 62.0 in | Wt 145.0 lb

## 2021-10-28 DIAGNOSIS — R070 Pain in throat: Secondary | ICD-10-CM | POA: Diagnosis not present

## 2021-10-28 DIAGNOSIS — R140 Abdominal distension (gaseous): Secondary | ICD-10-CM

## 2021-10-28 DIAGNOSIS — K219 Gastro-esophageal reflux disease without esophagitis: Secondary | ICD-10-CM | POA: Diagnosis not present

## 2021-10-28 DIAGNOSIS — Z8619 Personal history of other infectious and parasitic diseases: Secondary | ICD-10-CM | POA: Diagnosis not present

## 2021-10-28 DIAGNOSIS — R198 Other specified symptoms and signs involving the digestive system and abdomen: Secondary | ICD-10-CM | POA: Diagnosis not present

## 2021-10-28 DIAGNOSIS — J22 Unspecified acute lower respiratory infection: Secondary | ICD-10-CM | POA: Diagnosis not present

## 2021-10-28 MED ORDER — OMEPRAZOLE 40 MG PO CPDR: 40.0000 mg | DELAYED_RELEASE_CAPSULE | Freq: Every day | ORAL | 3 refills | Status: AC

## 2021-10-28 NOTE — Progress Notes (Signed)
? ?Chief Complaint: Abdominal pain ? ?HPI : 41 year old female with history of bipolar disorder, depression, rheumatoid arthritis, and IBS-D presents with ab pain ? ?Interval History: She is having alternating constipation and diarrhea. The diarrheal episodes can be fairly severe when they do come on, occurring >10 times per day. The migraine medication Nurtec that she is on may be causing her to be more constipated. She has stopped her ibuprofen due to signs of gastritis seen on her EGD, but she has been having joint pains. The joint pains have been keeping her up at night. She has been compliant with her omeprazole medication. Her reflux symptoms have improved on the omeprazole therapy. She has continued to notice that she has issues with abdominal distension.  ? ?Current Outpatient Medications  ?Medication Sig Dispense Refill  ? acetaminophen (TYLENOL) 325 MG tablet Take 650 mg by mouth every 6 (six) hours as needed.    ? cloNIDine (CATAPRES) 0.1 MG tablet Take 1 tablet (0.1 mg total) by mouth 2 (two) times daily. 180 tablet 1  ? Erenumab-aooe 70 MG/ML SOAJ Inject 140 mg into the skin every 30 (thirty) days.    ? Levocetirizine Dihydrochloride (XYZAL PO) Take by mouth.    ? NURTEC 75 MG TBDP Take 1 tablet by mouth at bedtime.    ? omeprazole (PRILOSEC) 40 MG capsule Take 1 capsule (40 mg total) by mouth 2 (two) times daily. 90 capsule 3  ? OVER THE COUNTER MEDICATION Fiber One    ? Probiotic Product (PROBIOTIC DAILY PO) Take by mouth.    ? promethazine (PHENERGAN) 25 MG tablet Take 1 tablet by mouth every 12 (twelve) hours as needed for nausea.    ? propranolol (INDERAL) 40 MG tablet TAKE ONE TABLET BY MOUTH TWICE DAILY 180 tablet 1  ? tiZANidine (ZANAFLEX) 4 MG tablet Take 4 mg by mouth 2 (two) times daily as needed for spasms.    ? traZODone (DESYREL) 150 MG tablet Take 150 mg by mouth daily.    ? WEGOVY 0.25 MG/0.5ML SOAJ Inject into the skin.    ? ?No current facility-administered medications for this visit.   ? ?Review of Systems: ?All systems reviewed and negative except where noted in HPI.  ? ?Physical Exam: ?BP 130/70   Pulse 90   Ht 5\' 2"  (1.575 m)   Wt 145 lb (65.8 kg)   LMP 10/29/2016 (Exact Date)   BMI 26.52 kg/m?  ?Constitutional: Pleasant,well-developed, female in no acute distress. ?HEENT: Normocephalic and atraumatic. Conjunctivae are normal. No scleral icterus. ?Cardiovascular: Normal rate, regular rhythm.  ?Pulmonary/chest: Effort normal and breath sounds normal. No wheezing, rales or rhonchi. ?Abdominal: Soft, nondistended, mildly tender over the left upper quadrant. Bowel sounds active throughout. There are no masses palpable. No hepatomegaly. ?Extremities: No edema ?Neurological: Alert and oriented to person place and time. ?Skin: Skin is warm and dry. No rashes noted. ?Psychiatric: Normal mood and affect. Behavior is normal. ? ?Labs 07/2019: CMP with mildly decreased albumin of 3.6.  CBC unremarkable ? ?Labs 12/2020: CMP unremarkable ? ?TTE 10/19/19: ?IMPRESSIONS  ? 1. Left ventricular ejection fraction, by estimation, is 60 to 65%. The left ventricle has normal function. The left ventricle has no regional wall motion abnormalities. Left ventricular diastolic parameters were normal.  ? 2. Both TAPSE and S are abnormal and systolic function is poorly visualized on 4C and [place] views.. Right ventricular systolic function was not well visualized. The right ventricular size is normal. There is normal pulmonary artery systolic pressure.  ?  3. The mitral valve is normal in structure. No evidence of mitral valve regurgitation. No evidence of mitral stenosis.  ? 4. The aortic valve is tricuspid. Aortic valve regurgitation is not  ?visualized. No aortic stenosis is present.  ? 5. The inferior vena cava is normal in size with greater than 50% respiratory variability, suggesting right atrial pressure of 3 mmHg.  ? ?MRI Abd 05/14/21 (from [facility]) showed enhancing 9 mm lesion in the dome of the liver with  characteristics most consistent with benign hepatic hemangioma. ? ?EGD 08/19/21: ?- Normal esophagus. Biopsied. ?- Gastritis. Biopsied. ?- Normal examined duodenum. Biopsied. ?Path: ?1. Surgical [P], duodenal biopsy ?BENIGN DUODENAL MUCOSA WITH NO DIAGNOSTIC ABNORMALITY ?2. Surgical [P], gastric biopsy ?REACTIVE GASTROPATHY WITH MINIMAL CHRONIC GASTRITIS ?NEGATIVE FOR H. PYLORI, INTESTINAL METAPLASIA, DYSPLASIA AND CARCINOMA ?3. Surgical [P], esophageal biopsy ?REACTIVE SQUAMOUS MUCOSA ?MINIMAL CHRONIC GASTRITIS OF OXYNTOCARDIA TYPE GASTRIC MUCOSA ?NEGATIVE FOR INTESTINAL METAPLASIA, DYSPLASIA AND CARCINOMA ? ?ASSESSMENT AND PLAN: ?GERD ?N&V ?Alternating constipation diarrhea ?Abdominal bloating ?Patient has had some improvement in her reflux symptoms while on omeprazole therapy. However, she has had worsening arthralgias as a result of not being able her NSAID medications. Thus we discussed that there are risks and benefits to taking ibuprofen. Hopefully with a once a day dose of omeprazole, this will continue to keep her reflux symptoms under good control. We will see if following the low FODMAP diet may help with the patient's irregular bowel habits. Will also test her for SIBO due to her bloating and diarrheal symptoms. ?- Low FODMAP diet handout ?- Continue omeprazole 40 mg QD while she continues to take ibuprofen. Okay to restart taking ibuprofen to try to help with her joint pains. ?- Hydrogen breath testing for SIBO ?- RTC 2-3 months ? ?Eulah Pont, MD ? ?

## 2021-10-28 NOTE — Patient Instructions (Signed)
We have sent the following medications to your pharmacy for you to pick up at your convenience: ?Omeprazole 40 mg daily 30-60 minutes before breakfast.  ? ?You have been given a testing kit to check for small intestine bacterial overgrowth (SIBO) which is completed by a company named [facility]. Make sure to return your test in the mail using the return mailing label given to you along with the kit. Your demographic and insurance information have already been sent to the company and they should be in contact with you over the next 1-2 weeks regarding this test. [facility] will collect an upfront charge of $99.74 for commercial insurance plans and $209.74 is you are paying cash. Make sure to discuss with [facility] PRIOR to having the test to see if they have gotten information from your insurance company as to how much your testing will cost out of pocket, if any. Please keep in mind that you will be getting a call from phone number [phone] or a similar number. If you do not hear from them within this time frame, please call our office at [phone] or call [facility] directly at [phone].  ? ?Low FODmap diet information provided.  ? ?If you are age 34 or older, your body mass index should be between 23-30. Your Body mass index is 26.52 kg/m?Marland Kitchen If this is out of the aforementioned range listed, please consider follow up with your Primary Care Provider. ? ?If you are age 65 or younger, your body mass index should be between 19-25. Your Body mass index is 26.52 kg/m?Marland Kitchen If this is out of the aformentioned range listed, please consider follow up with your Primary Care Provider.  ? ?________________________________________________________ ? ?The [facility] providers would like to encourage you to use [facility] to communicate with providers for non-urgent requests or questions.  Due to long hold times on the telephone, sending your provider a message by [facility] may be a faster and more  efficient way to get a response.  Please allow 48 business hours for a response.  Please remember that this is for non-urgent requests.  ?_______________________________________________________ ? ?

## 2021-10-31 ENCOUNTER — Ambulatory Visit (INDEPENDENT_AMBULATORY_CARE_PROVIDER_SITE_OTHER): Payer: BC Managed Care – PPO | Admitting: Cardiology

## 2021-10-31 ENCOUNTER — Encounter: Payer: Self-pay | Admitting: Cardiology

## 2021-10-31 VITALS — BP 126/98 | HR 75 | Ht 62.0 in | Wt 145.0 lb

## 2021-10-31 DIAGNOSIS — M069 Rheumatoid arthritis, unspecified: Secondary | ICD-10-CM

## 2021-10-31 DIAGNOSIS — I10 Essential (primary) hypertension: Secondary | ICD-10-CM

## 2021-10-31 DIAGNOSIS — R000 Tachycardia, unspecified: Secondary | ICD-10-CM

## 2021-10-31 DIAGNOSIS — E782 Mixed hyperlipidemia: Secondary | ICD-10-CM

## 2021-10-31 DIAGNOSIS — R0609 Other forms of dyspnea: Secondary | ICD-10-CM

## 2021-10-31 NOTE — Progress Notes (Signed)
?Cardiology Office Note:   ? ?Date:  10/31/2021  ? ?ID:  Mary Dean, DOB 1980/11/09, MRN [id number] ? ?PCP:  Doran Stabler, NP  ?Cardiologist:  Gypsy Balsam, MD   ? ?Referring MD: Marlyn Corporal, PA  ? ?Chief Complaint  ?Patient presents with  ? discuss ultrasound of the carotid   ? ? ?History of Present Illness:   ? ?Mary Dean is a 41 y.o. female with past medical history significant for essential hypertension, dyspnea on exertion, atypical chest pain, also palpitations.  She comes for regular follow-up.  Overall she seems to be doing quite well.  Sadly she still continues to smoke.  She is trying to lose weight and she is succeeding.  She said that she is going to quit smoking when she lost couple more pounds.  She is afraid quitting smoking will make her to gain some weight.  She denies have any chest pain tightness squeezing pressure burning chest no shortness of breath no swelling of lower extremities, overall seems to be doing well but did notice some strength sensation of the left side of her neck.  Sometimes she will see some palpitations over there.  She is worrying about the fact that she may have some carotic arterial disease.  On the physical exam I do not see anything worrisome.  There is no bruit however considering her multiple risk factors for coronary artery disease and peripheral vascular disease I think it would be reasonable to do arterial duplex of her carotids ? ?Past Medical History:  ?Diagnosis Date  ? Abnormal Pap smear   ? Acute renal failure syndrome (HCC) 09/18/2020  ? Atopic dermatitis 09/18/2020  ? Atypical chest pain 06/05/2019  ? Bipolar affective disorder, currently depressed, moderate (HCC) 09/18/2020  ? Bite of nonvenomous arthropod 09/18/2020  ? Compulsive behavior 09/18/2020  ? Depression   ? Dyspnea on exertion 03/31/2019  ? Essential hypertension 03/31/2019  ? Generalized anxiety disorder 09/18/2020  ? Hypertension   ? Insomnia 03/31/2019  ? Irregular  menstruation 09/18/2020  ? Irritable bowel syndrome with diarrhea 09/18/2020  ? Major depression, single episode 09/18/2020  ? Migraines   ? Mixed hyperlipidemia 09/18/2020  ? Osteoporosis 09/18/2020  ? Other long term (current) drug therapy 09/18/2020  ? Palpitations 03/31/2019  ? Pneumonia   ? Psoriasis 09/18/2020  ? RA (rheumatoid arthritis) (HCC)   ? Raynaud's disease   ? Refractory migraine with aura 09/18/2020  ? Rheumatoid arthritis (HCC) 03/31/2019  ? [place] spotted fever 01/17/2019  ? Seizures (HCC)   ? for tking medication Viibryd  ? Tachycardia 09/18/2020  ? Vitamin D deficiency 09/18/2020  ? ? ?Past Surgical History:  ?Procedure Laterality Date  ? ANKLE SURGERY Right   ? BREAST ENHANCEMENT SURGERY  2015  ? IVF  2012  ? TUBAL LIGATION    ? UPPER GASTROINTESTINAL ENDOSCOPY    ? VAGINAL HYSTERECTOMY    ? still have ovaries  ? WISDOM TOOTH EXTRACTION    ? ? ?Current Medications: ?Current Meds  ?Medication Sig  ? cloNIDine (CATAPRES) 0.1 MG tablet Take 1 tablet (0.1 mg total) by mouth 2 (two) times daily.  ? Erenumab-aooe 70 MG/ML SOAJ Inject 140 mg into the skin every 30 (thirty) days.  ? ibuprofen (ADVIL) 400 MG tablet Take 400 mg by mouth every 6 (six) hours as needed for mild pain or moderate pain.  ? NURTEC 75 MG TBDP Take 1 tablet by mouth at bedtime.  ? omeprazole (PRILOSEC) 40 MG capsule  Take 1 capsule (40 mg total) by mouth daily.  ? OVER THE COUNTER MEDICATION Fiber One  ? Probiotic Product (PROBIOTIC DAILY PO) Take by mouth.  ? promethazine (PHENERGAN) 25 MG tablet Take 1 tablet by mouth every 12 (twelve) hours as needed for nausea.  ? propranolol (INDERAL) 40 MG tablet TAKE ONE TABLET BY MOUTH TWICE DAILY  ? tiZANidine (ZANAFLEX) 4 MG tablet Take 4 mg by mouth 2 (two) times daily as needed for spasms.  ? traZODone (DESYREL) 150 MG tablet Take 150 mg by mouth daily.  ? WEGOVY 0.25 MG/0.5ML SOAJ Inject into the skin.  ?  ? ?Allergies:   Baclofen, Humira [adalimumab], Prednisone, Sumatriptan,  Topiramate, Topiramate er, Vilazodone, Vilazodone hcl, and Amitriptyline hcl  ? ?Social History  ? ?Socioeconomic History  ? Marital status: Married  ?  Spouse name: Peyton NajjarLarry  ? Number of children: 2  ? Years of education: Not on file  ? Highest education level: Associate degree: academic program  ?Occupational History  ? Occupation: unemployed  ?Tobacco Use  ? Smoking status: Every Day  ?  Packs/day: 0.50  ?  Types: Cigarettes  ? Smokeless tobacco: Never  ? Tobacco comments:  ?  Started smoking age 41   ?Vaping Use  ? Vaping Use: Never used  ?Substance and Sexual Activity  ? Alcohol use: Never  ? Drug use: Never  ? Sexual activity: Yes  ?Other Topics Concern  ? Not on file  ?Social History Narrative  ? Patient is right-handed. She lives with her husband in a 2 level home, master on 1st. She drinks two to three 12 oz glasses of tea and same amount of of Dr. Reino KentPepper sodas. She does not exercise.  ? ?Social Determinants of Health  ? ?Financial Resource Strain: Not on file  ?Food Insecurity: Not on file  ?Transportation Needs: Not on file  ?Physical Activity: Not on file  ?Stress: Not on file  ?Social Connections: Not on file  ?  ? ?Family History: ?The patient's family history includes Alzheimer's disease in her maternal grandmother and paternal grandmother; Anxiety disorder in her sister; Diabetes in her maternal aunt, maternal grandmother, maternal uncle, and mother; Heart attack in her maternal grandmother; Heart disease in her maternal grandmother and mother; Hyperlipidemia in her father; Hypertension in her father and maternal grandmother; Irritable bowel syndrome in her sister; Kidney disease in her maternal grandmother; Liver disease in her maternal grandmother; Pancreatic cancer in her maternal great-grandmother; Prostate cancer in her father; Thyroid disease in her mother. There is no history of Colon cancer, Esophageal cancer, Rectal cancer, or Stomach cancer. ?ROS:   ?Please see the history of present illness.     ?All 14 point review of systems negative except as described per history of present illness ? ?EKGs/Labs/Other Studies Reviewed:   ? ? ? ?Recent Labs: ?08/06/2021: ALT 12; BUN 9; Creatinine, Ser 1.04; Hemoglobin 13.3; Platelets 239.0; Potassium 3.2; Sodium 140; TSH 1.15  ?Recent Lipid Panel ?No results found for: CHOL, TRIG, HDL, CHOLHDL, VLDL, LDLCALC, LDLDIRECT ? ?Physical Exam:   ? ?VS:  BP (!) 126/98 (BP Location: Left Arm)   Pulse 75   Ht 5\' 2"  (1.575 m)   Wt 145 lb (65.8 kg)   LMP 10/29/2016 (Exact Date)   SpO2 97%   BMI 26.52 kg/m?    ? ?Wt Readings from Last 3 Encounters:  ?10/31/21 145 lb (65.8 kg)  ?10/28/21 145 lb (65.8 kg)  ?08/19/21 152 lb (68.9 kg)  ?  ? ?GEN:  Well nourished, well developed in no acute distress ?HEENT: Normal ?NECK: No JVD; No carotid bruits ?LYMPHATICS: No lymphadenopathy ?CARDIAC: RRR, no murmurs, no rubs, no gallops ?RESPIRATORY:  Clear to auscultation without rales, wheezing or rhonchi  ?ABDOMEN: Soft, non-tender, non-distended ?MUSCULOSKELETAL:  No edema; No deformity  ?SKIN: Warm and dry ?LOWER EXTREMITIES: no swelling ?NEUROLOGIC:  Alert and oriented x 3 ?PSYCHIATRIC:  Normal affect  ? ?ASSESSMENT:   ? ?1. Essential hypertension   ?2. Mixed hyperlipidemia   ?3. Tachycardia   ?4. Dyspnea on exertion   ?5. Rheumatoid arthritis of wrist, unspecified laterality, unspecified whether rheumatoid factor present (HCC)   ? ?PLAN:   ? ?In order of problems listed above: ? ?Essential hypertension blood pressure well controlled continue present management. ?Dyslipidemia, she tells me her primary care physician recently checked her cholesterol call the office try to get a copy of it. ?Dyspnea on exertion denies having any ?Concern about carotic arterial disease.  We will do carotic ultrasound. ?Rheumatoid arthritis stable. ? ? ?Medication Adjustments/Labs and Tests Ordered: ?Current medicines are reviewed at length with the patient today.  Concerns regarding medicines are outlined  above.  ?No orders of the defined types were placed in this encounter. ? ?Medication changes: No orders of the defined types were placed in this encounter. ? ? ?Signed, ?Georgeanna Lea, MD, [facility] ?10/31/2021 9:15

## 2021-10-31 NOTE — Patient Instructions (Signed)
Medication Instructions:  ?Your physician recommends that you continue on your current medications as directed. Please refer to the Current Medication list given to you today. ? ?*If you need a refill on your cardiac medications before your next appointment, please call your pharmacy* ? ? ?Lab Work: ?None ?If you have labs (blood work) drawn today and your tests are completely normal, you will receive your results only by: ?MyChart Message (if you have MyChart) OR ?A paper copy in the mail ?If you have any lab test that is abnormal or we need to change your treatment, we will call you to review the results. ? ? ?Testing/Procedures: ?Your physician has requested that you have a carotid duplex. This test is an ultrasound of the carotid arteries in your neck. It looks at blood flow through these arteries that supply the brain with blood. Allow one hour for this exam. There are no restrictions or special instructions.  ? ? ?Follow-Up: ?At [facility], you and your health needs are our priority.  As part of our continuing mission to provide you with exceptional heart care, we have created designated Provider Care Teams.  These Care Teams include your primary Cardiologist (physician) and Advanced Practice Providers (APPs -  Physician Assistants and Nurse Practitioners) who all work together to provide you with the care you need, when you need it. ? ?We recommend signing up for the patient portal called "MyChart".  Sign up information is provided on this After Visit Summary.  MyChart is used to connect with patients for Virtual Visits (Telemedicine).  Patients are able to view lab/test results, encounter notes, upcoming appointments, etc.  Non-urgent messages can be sent to your provider as well.   ?To learn more about what you can do with MyChart, go to [email or web address].   ? ?Your next appointment:   ?1 year(s) ? ?The format for your next appointment:   ?In Person ? ?Provider:   ?Gypsy Balsamobert Krasowski, MD  ? ? ?Other  Instructions ?None ? ?

## 2021-10-31 NOTE — Addendum Note (Signed)
Addended by: Edwyna Shell I on: 10/31/2021 09:24 AM ? ? Modules accepted: Orders ? ?

## 2021-10-31 NOTE — Addendum Note (Signed)
Addended by: Edwyna Shell I on: 10/31/2021 03:26 PM ? ? Modules accepted: Orders ? ?

## 2021-11-14 ENCOUNTER — Telehealth: Payer: Self-pay | Admitting: Internal Medicine

## 2021-11-14 NOTE — Telephone Encounter (Signed)
Patient called requesting to speak with Dr. Leonides Schanz. Per patient, has had diarrhea 3 weeks. On 11/08/21 she received IV fluids. Patient wants to know what she can do? Is there any medications that can help? Please advise. ?

## 2021-11-14 NOTE — Telephone Encounter (Signed)
Returned pt call. States she has a h/o IBS-D. Was Rx'd Amoxicillin for 10 days d/t upper resp infection. Has now been off ATBs for 8 days. States she went to a holistic provider on Saturday (4/15) who gave her 1 L of NS, 1 "bag of magnesium", "migraine cocktail" and bag of vitamins". Denies N/V, fever or rectal bleeding. Has been having infrequent abd pain and cramping. Stool color ranges from brown to yellow and is very loose. States she can have up to 8-10 loose stools daily. Today has had 3. Does not have an appetite though has not lost any weight, experience any weakness,fatigue or dizziness. States she has taken Viberzi in the past although it was no affordable. Is asking for an alternative to Viberzi. Provided with the following recommendations: ? ?Remain hydrated with 64 ounces or more of fluid daily. ?Ensure fluids contain electrolytes ?Consider bland foods or BRAT diet ?May consider OTC Imodium prn ? ?Routing this message to Dr. Leonides Schanzorsey to determine if stool studies should be considered or if she has any further recommendations. ?

## 2021-11-17 NOTE — Telephone Encounter (Signed)
SECOND ATTEMPT: ° °LVM requesting returned call. °

## 2021-11-17 NOTE — Telephone Encounter (Signed)
Called pt to inquire about her symptoms and to determine if still having loose stools. LVM requesting returned call. ?

## 2021-11-18 NOTE — Telephone Encounter (Signed)
THIRD ATTEMPT: ? ?LVM requesting returned call. ?

## 2021-11-19 ENCOUNTER — Telehealth: Payer: Self-pay

## 2021-11-19 NOTE — Telephone Encounter (Signed)
FINAL ATTEMPT:  LVM requesting returned call 

## 2021-11-19 NOTE — Telephone Encounter (Unsigned)
Patient is needing a refill on her propranolol 40 mg and Clonidine 0.1 mg sent into [facility] pharmacy in [place] asap. Patient only has 1 pill left of the propranolol. Thank you. CB # [phone] ?

## 2021-11-20 ENCOUNTER — Other Ambulatory Visit: Payer: Self-pay

## 2021-11-20 MED ORDER — CLONIDINE HCL 0.1 MG PO TABS: 0.1000 mg | ORAL_TABLET | Freq: Two times a day (BID) | ORAL | 3 refills | Status: AC

## 2021-11-20 MED ORDER — PROPRANOLOL HCL 40 MG PO TABS: 40.0000 mg | ORAL_TABLET | Freq: Two times a day (BID) | ORAL | 3 refills | Status: AC

## 2021-11-24 DIAGNOSIS — Z6825 Body mass index (BMI) 25.0-25.9, adult: Secondary | ICD-10-CM | POA: Diagnosis not present

## 2021-11-24 DIAGNOSIS — M069 Rheumatoid arthritis, unspecified: Secondary | ICD-10-CM | POA: Diagnosis not present

## 2021-12-01 ENCOUNTER — Ambulatory Visit (INDEPENDENT_AMBULATORY_CARE_PROVIDER_SITE_OTHER): Payer: BC Managed Care – PPO

## 2021-12-01 DIAGNOSIS — I6523 Occlusion and stenosis of bilateral carotid arteries: Secondary | ICD-10-CM | POA: Diagnosis not present

## 2021-12-01 DIAGNOSIS — R000 Tachycardia, unspecified: Secondary | ICD-10-CM

## 2021-12-01 DIAGNOSIS — R0609 Other forms of dyspnea: Secondary | ICD-10-CM

## 2021-12-01 DIAGNOSIS — E782 Mixed hyperlipidemia: Secondary | ICD-10-CM

## 2021-12-01 DIAGNOSIS — M069 Rheumatoid arthritis, unspecified: Secondary | ICD-10-CM

## 2021-12-01 DIAGNOSIS — I10 Essential (primary) hypertension: Secondary | ICD-10-CM

## 2021-12-03 ENCOUNTER — Telehealth: Payer: Self-pay

## 2021-12-03 NOTE — Telephone Encounter (Signed)
Patient notified of results.

## 2021-12-03 NOTE — Telephone Encounter (Signed)
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

## 2021-12-30 ENCOUNTER — Ambulatory Visit: Payer: BC Managed Care – PPO | Admitting: Internal Medicine

## 2022-01-02 DIAGNOSIS — E669 Obesity, unspecified: Secondary | ICD-10-CM | POA: Diagnosis not present

## 2022-01-02 DIAGNOSIS — G43909 Migraine, unspecified, not intractable, without status migrainosus: Secondary | ICD-10-CM | POA: Diagnosis not present

## 2022-01-02 DIAGNOSIS — F419 Anxiety disorder, unspecified: Secondary | ICD-10-CM | POA: Diagnosis not present

## 2022-01-02 DIAGNOSIS — E559 Vitamin D deficiency, unspecified: Secondary | ICD-10-CM | POA: Diagnosis not present

## 2022-01-02 DIAGNOSIS — G4700 Insomnia, unspecified: Secondary | ICD-10-CM | POA: Diagnosis not present

## 2022-01-02 DIAGNOSIS — I10 Essential (primary) hypertension: Secondary | ICD-10-CM | POA: Diagnosis not present

## 2022-01-02 DIAGNOSIS — R739 Hyperglycemia, unspecified: Secondary | ICD-10-CM | POA: Diagnosis not present

## 2022-01-16 IMAGING — DX DG CHEST 2V
2 series · 2 of 2 positions shown · non-contrast
Comparison: 05/03/2019

CLINICAL DATA: Leukocytosis.

EXAM:
CHEST - 2 VIEW

[chest pa]
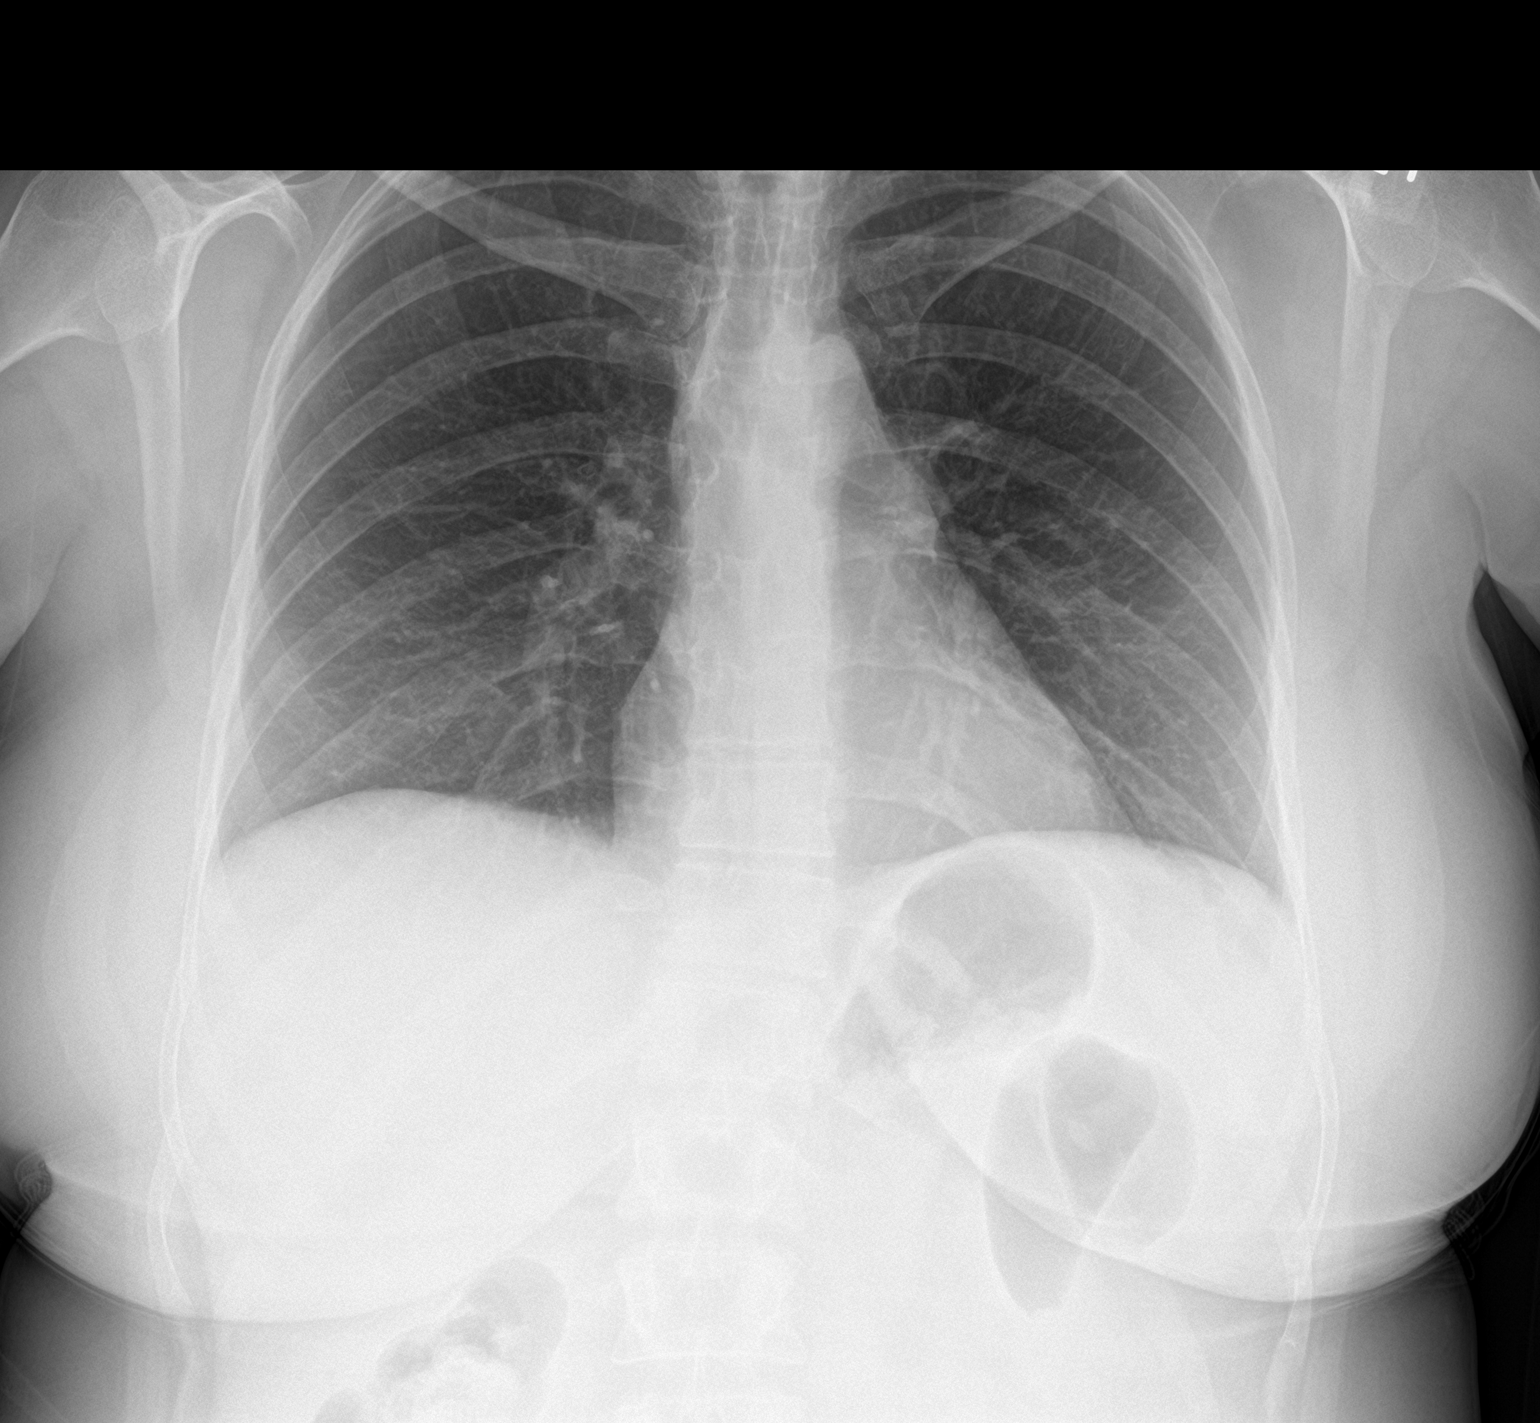

[chest lat]
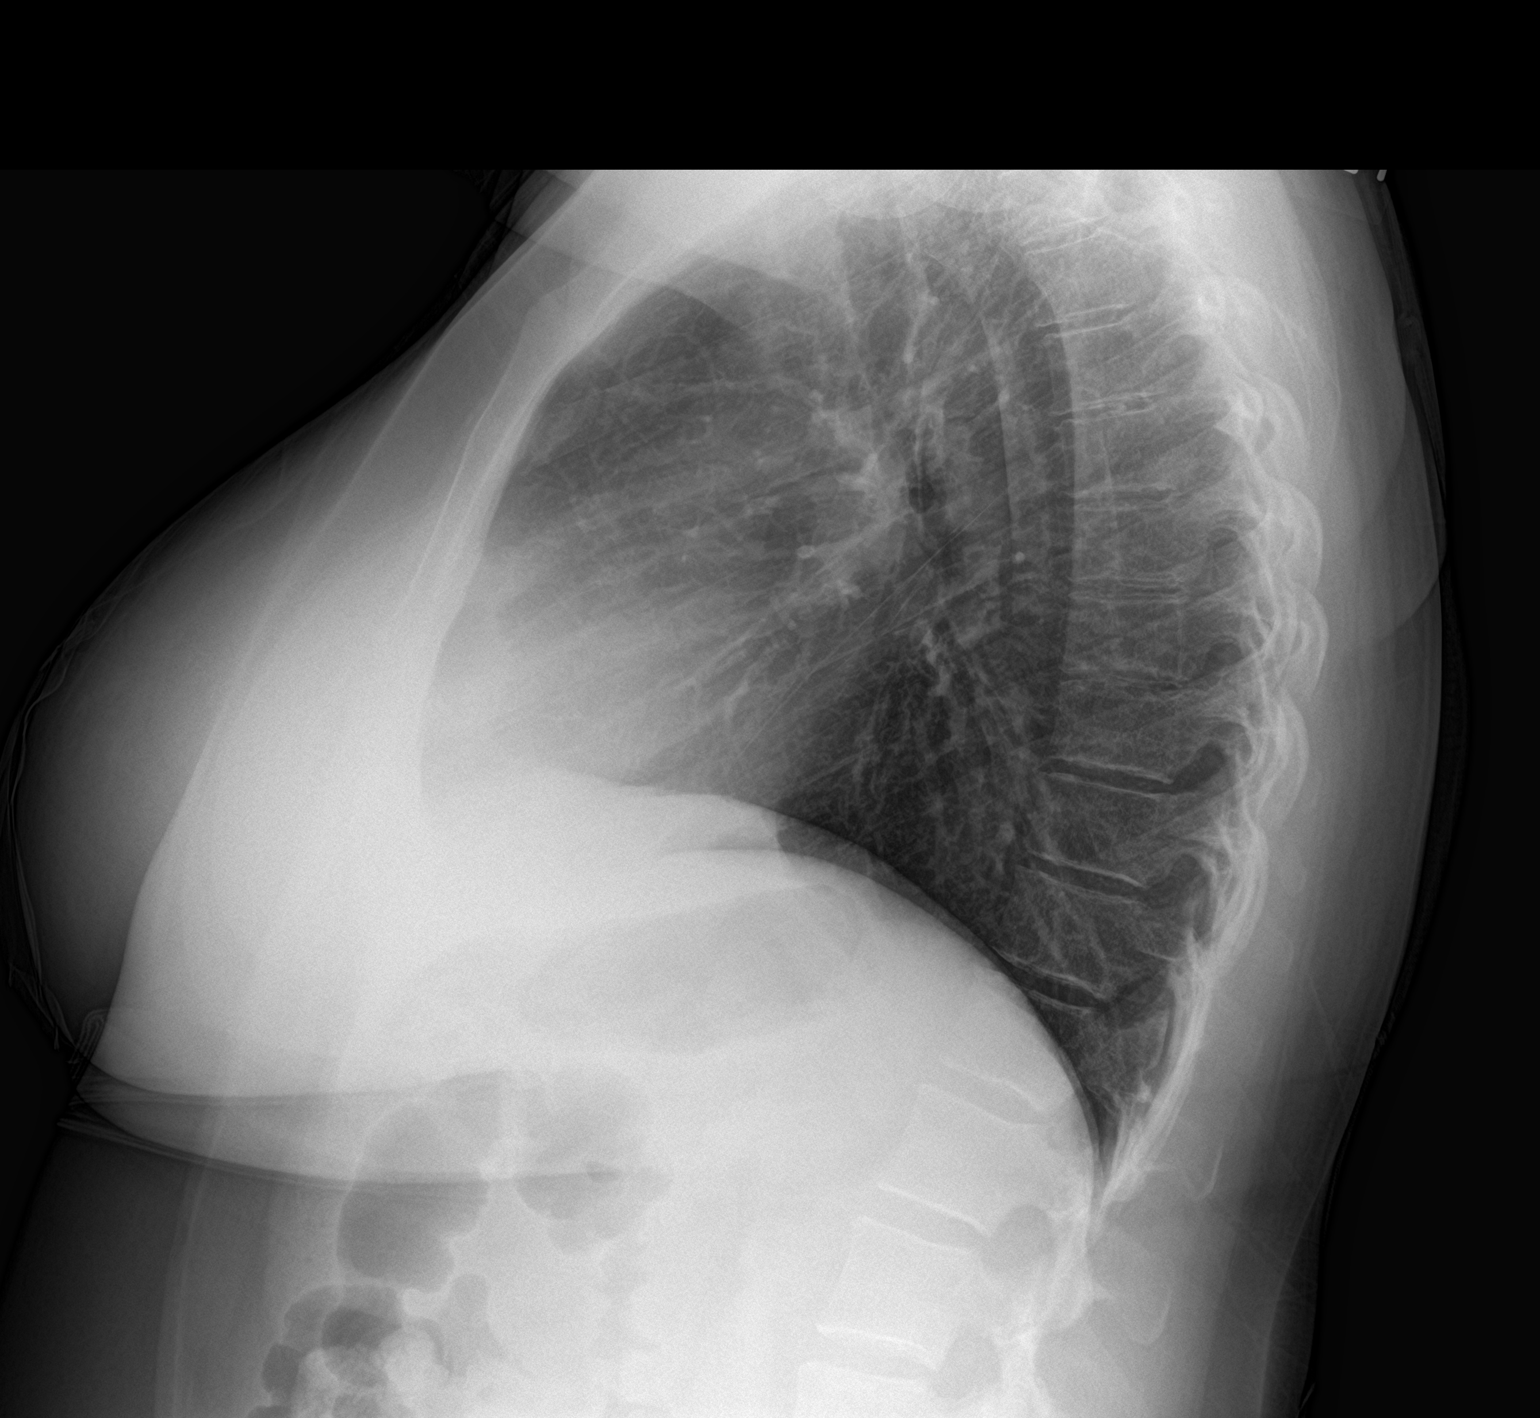

[2 of 2 positions shown; findings below may reference images not displayed]

FINDINGS: The heart size and mediastinal contours are within normal limits.
Both lungs are clear. The visualized skeletal structures are
unremarkable.
IMPRESSION: No active cardiopulmonary disease.

## 2022-02-16 ENCOUNTER — Encounter: Payer: Self-pay | Admitting: Internal Medicine

## 2022-03-05 DIAGNOSIS — F419 Anxiety disorder, unspecified: Secondary | ICD-10-CM | POA: Diagnosis not present

## 2022-03-05 DIAGNOSIS — Z6825 Body mass index (BMI) 25.0-25.9, adult: Secondary | ICD-10-CM | POA: Diagnosis not present

## 2022-03-24 DIAGNOSIS — B079 Viral wart, unspecified: Secondary | ICD-10-CM | POA: Diagnosis not present

## 2022-03-26 DIAGNOSIS — R051 Acute cough: Secondary | ICD-10-CM | POA: Diagnosis not present

## 2022-03-26 DIAGNOSIS — R070 Pain in throat: Secondary | ICD-10-CM | POA: Diagnosis not present

## 2022-03-26 DIAGNOSIS — R0981 Nasal congestion: Secondary | ICD-10-CM | POA: Diagnosis not present

## 2022-04-21 DIAGNOSIS — R197 Diarrhea, unspecified: Secondary | ICD-10-CM | POA: Diagnosis not present

## 2022-04-21 DIAGNOSIS — R112 Nausea with vomiting, unspecified: Secondary | ICD-10-CM | POA: Diagnosis not present

## 2022-04-21 DIAGNOSIS — U071 COVID-19: Secondary | ICD-10-CM | POA: Diagnosis not present

## 2022-04-21 DIAGNOSIS — Z20822 Contact with and (suspected) exposure to covid-19: Secondary | ICD-10-CM | POA: Diagnosis not present

## 2022-04-29 DIAGNOSIS — I10 Essential (primary) hypertension: Secondary | ICD-10-CM | POA: Diagnosis not present

## 2022-04-30 DIAGNOSIS — G43119 Migraine with aura, intractable, without status migrainosus: Secondary | ICD-10-CM | POA: Diagnosis not present

## 2022-05-12 DIAGNOSIS — Z6825 Body mass index (BMI) 25.0-25.9, adult: Secondary | ICD-10-CM | POA: Diagnosis not present

## 2022-05-12 DIAGNOSIS — F419 Anxiety disorder, unspecified: Secondary | ICD-10-CM | POA: Diagnosis not present

## 2022-06-04 DIAGNOSIS — I10 Essential (primary) hypertension: Secondary | ICD-10-CM | POA: Diagnosis not present

## 2022-06-04 DIAGNOSIS — R6889 Other general symptoms and signs: Secondary | ICD-10-CM | POA: Diagnosis not present

## 2022-06-04 DIAGNOSIS — M069 Rheumatoid arthritis, unspecified: Secondary | ICD-10-CM | POA: Diagnosis not present

## 2022-06-04 DIAGNOSIS — J029 Acute pharyngitis, unspecified: Secondary | ICD-10-CM | POA: Diagnosis not present

## 2022-07-02 DIAGNOSIS — J329 Chronic sinusitis, unspecified: Secondary | ICD-10-CM | POA: Diagnosis not present

## 2022-07-02 DIAGNOSIS — G43909 Migraine, unspecified, not intractable, without status migrainosus: Secondary | ICD-10-CM | POA: Diagnosis not present

## 2022-07-02 DIAGNOSIS — Z6825 Body mass index (BMI) 25.0-25.9, adult: Secondary | ICD-10-CM | POA: Diagnosis not present

## 2022-07-10 DIAGNOSIS — J029 Acute pharyngitis, unspecified: Secondary | ICD-10-CM | POA: Diagnosis not present

## 2022-07-10 DIAGNOSIS — B349 Viral infection, unspecified: Secondary | ICD-10-CM | POA: Diagnosis not present

## 2022-07-10 DIAGNOSIS — R051 Acute cough: Secondary | ICD-10-CM | POA: Diagnosis not present

## 2022-07-10 DIAGNOSIS — J349 Unspecified disorder of nose and nasal sinuses: Secondary | ICD-10-CM | POA: Diagnosis not present

## 2022-07-14 DIAGNOSIS — Z6825 Body mass index (BMI) 25.0-25.9, adult: Secondary | ICD-10-CM | POA: Diagnosis not present

## 2022-07-14 DIAGNOSIS — R6889 Other general symptoms and signs: Secondary | ICD-10-CM | POA: Diagnosis not present

## 2022-07-14 DIAGNOSIS — M069 Rheumatoid arthritis, unspecified: Secondary | ICD-10-CM | POA: Diagnosis not present

## 2022-07-14 DIAGNOSIS — J069 Acute upper respiratory infection, unspecified: Secondary | ICD-10-CM | POA: Diagnosis not present

## 2022-08-31 DIAGNOSIS — Z6823 Body mass index (BMI) 23.0-23.9, adult: Secondary | ICD-10-CM | POA: Diagnosis not present

## 2022-08-31 DIAGNOSIS — G4700 Insomnia, unspecified: Secondary | ICD-10-CM | POA: Diagnosis not present

## 2022-08-31 DIAGNOSIS — M069 Rheumatoid arthritis, unspecified: Secondary | ICD-10-CM | POA: Diagnosis not present

## 2022-08-31 DIAGNOSIS — I10 Essential (primary) hypertension: Secondary | ICD-10-CM | POA: Diagnosis not present

## 2022-09-01 ENCOUNTER — Ambulatory Visit
Admission: RE | Admit: 2022-09-01 | Discharge: 2022-09-01 | Disposition: A | Discharge status: Home / Self Care | Payer: BC Managed Care – PPO | Source: Ambulatory Visit | Attending: Adult Health | Admitting: Adult Health

## 2022-09-01 ENCOUNTER — Other Ambulatory Visit: Payer: Self-pay | Admitting: Adult Health

## 2022-09-01 DIAGNOSIS — R928 Other abnormal and inconclusive findings on diagnostic imaging of breast: Secondary | ICD-10-CM | POA: Diagnosis not present

## 2022-09-01 DIAGNOSIS — N6320 Unspecified lump in the left breast, unspecified quadrant: Secondary | ICD-10-CM

## 2022-09-01 DIAGNOSIS — N6489 Other specified disorders of breast: Secondary | ICD-10-CM | POA: Diagnosis not present

## 2022-09-03 DIAGNOSIS — Z01419 Encounter for gynecological examination (general) (routine) without abnormal findings: Secondary | ICD-10-CM | POA: Diagnosis not present

## 2022-09-03 DIAGNOSIS — Z6823 Body mass index (BMI) 23.0-23.9, adult: Secondary | ICD-10-CM | POA: Diagnosis not present

## 2022-09-18 DIAGNOSIS — L03317 Cellulitis of buttock: Secondary | ICD-10-CM | POA: Diagnosis not present

## 2022-09-18 DIAGNOSIS — R059 Cough, unspecified: Secondary | ICD-10-CM | POA: Diagnosis not present

## 2022-09-18 DIAGNOSIS — J069 Acute upper respiratory infection, unspecified: Secondary | ICD-10-CM | POA: Diagnosis not present

## 2022-09-25 DIAGNOSIS — B3731 Acute candidiasis of vulva and vagina: Secondary | ICD-10-CM | POA: Diagnosis not present

## 2022-09-25 DIAGNOSIS — J069 Acute upper respiratory infection, unspecified: Secondary | ICD-10-CM | POA: Diagnosis not present

## 2022-09-25 DIAGNOSIS — Z6823 Body mass index (BMI) 23.0-23.9, adult: Secondary | ICD-10-CM | POA: Diagnosis not present

## 2022-10-19 DIAGNOSIS — G4700 Insomnia, unspecified: Secondary | ICD-10-CM | POA: Diagnosis not present

## 2022-10-19 DIAGNOSIS — G43909 Migraine, unspecified, not intractable, without status migrainosus: Secondary | ICD-10-CM | POA: Diagnosis not present

## 2022-10-19 DIAGNOSIS — M069 Rheumatoid arthritis, unspecified: Secondary | ICD-10-CM | POA: Diagnosis not present

## 2022-10-19 DIAGNOSIS — I10 Essential (primary) hypertension: Secondary | ICD-10-CM | POA: Diagnosis not present

## 2022-10-19 DIAGNOSIS — K219 Gastro-esophageal reflux disease without esophagitis: Secondary | ICD-10-CM | POA: Diagnosis not present

## 2022-11-16 DIAGNOSIS — E785 Hyperlipidemia, unspecified: Secondary | ICD-10-CM | POA: Diagnosis not present

## 2022-11-16 DIAGNOSIS — R110 Nausea: Secondary | ICD-10-CM | POA: Diagnosis not present

## 2022-11-16 DIAGNOSIS — R739 Hyperglycemia, unspecified: Secondary | ICD-10-CM | POA: Diagnosis not present

## 2022-11-16 DIAGNOSIS — F419 Anxiety disorder, unspecified: Secondary | ICD-10-CM | POA: Diagnosis not present

## 2022-11-16 DIAGNOSIS — E559 Vitamin D deficiency, unspecified: Secondary | ICD-10-CM | POA: Diagnosis not present

## 2022-11-16 DIAGNOSIS — I10 Essential (primary) hypertension: Secondary | ICD-10-CM | POA: Diagnosis not present

## 2022-11-16 DIAGNOSIS — G43909 Migraine, unspecified, not intractable, without status migrainosus: Secondary | ICD-10-CM | POA: Diagnosis not present

## 2022-12-24 DIAGNOSIS — B079 Viral wart, unspecified: Secondary | ICD-10-CM | POA: Diagnosis not present

## 2022-12-24 DIAGNOSIS — D485 Neoplasm of uncertain behavior of skin: Secondary | ICD-10-CM | POA: Diagnosis not present

## 2023-01-18 DIAGNOSIS — F419 Anxiety disorder, unspecified: Secondary | ICD-10-CM | POA: Diagnosis not present

## 2023-01-18 DIAGNOSIS — I10 Essential (primary) hypertension: Secondary | ICD-10-CM | POA: Diagnosis not present

## 2023-01-18 DIAGNOSIS — G43909 Migraine, unspecified, not intractable, without status migrainosus: Secondary | ICD-10-CM | POA: Diagnosis not present

## 2023-01-18 DIAGNOSIS — K219 Gastro-esophageal reflux disease without esophagitis: Secondary | ICD-10-CM | POA: Diagnosis not present

## 2023-02-10 DIAGNOSIS — Z6824 Body mass index (BMI) 24.0-24.9, adult: Secondary | ICD-10-CM | POA: Diagnosis not present

## 2023-02-10 DIAGNOSIS — R079 Chest pain, unspecified: Secondary | ICD-10-CM | POA: Diagnosis not present

## 2023-02-10 DIAGNOSIS — I10 Essential (primary) hypertension: Secondary | ICD-10-CM | POA: Diagnosis not present

## 2023-02-10 DIAGNOSIS — R0602 Shortness of breath: Secondary | ICD-10-CM | POA: Diagnosis not present

## 2023-02-10 DIAGNOSIS — R350 Frequency of micturition: Secondary | ICD-10-CM | POA: Diagnosis not present

## 2023-03-10 DIAGNOSIS — I10 Essential (primary) hypertension: Secondary | ICD-10-CM | POA: Diagnosis not present

## 2023-03-10 DIAGNOSIS — G43909 Migraine, unspecified, not intractable, without status migrainosus: Secondary | ICD-10-CM | POA: Diagnosis not present

## 2023-03-10 DIAGNOSIS — M069 Rheumatoid arthritis, unspecified: Secondary | ICD-10-CM | POA: Diagnosis not present

## 2023-03-10 DIAGNOSIS — E785 Hyperlipidemia, unspecified: Secondary | ICD-10-CM | POA: Diagnosis not present

## 2023-03-10 DIAGNOSIS — R739 Hyperglycemia, unspecified: Secondary | ICD-10-CM | POA: Diagnosis not present

## 2023-03-10 DIAGNOSIS — F419 Anxiety disorder, unspecified: Secondary | ICD-10-CM | POA: Diagnosis not present

## 2023-03-10 DIAGNOSIS — E559 Vitamin D deficiency, unspecified: Secondary | ICD-10-CM | POA: Diagnosis not present

## 2023-03-24 ENCOUNTER — Encounter: Payer: Self-pay | Admitting: Cardiology

## 2023-03-24 DIAGNOSIS — T22211A Burn of second degree of right forearm, initial encounter: Secondary | ICD-10-CM | POA: Diagnosis not present

## 2023-03-24 DIAGNOSIS — T23261A Burn of second degree of back of right hand, initial encounter: Secondary | ICD-10-CM | POA: Diagnosis not present

## 2023-03-24 DIAGNOSIS — Z23 Encounter for immunization: Secondary | ICD-10-CM | POA: Diagnosis not present

## 2023-03-24 DIAGNOSIS — L03113 Cellulitis of right upper limb: Secondary | ICD-10-CM | POA: Diagnosis not present

## 2023-03-25 ENCOUNTER — Ambulatory Visit: Payer: BC Managed Care – PPO | Admitting: Cardiology

## 2023-03-30 DIAGNOSIS — G43A0 Cyclical vomiting, not intractable: Secondary | ICD-10-CM | POA: Diagnosis not present

## 2023-05-18 ENCOUNTER — Emergency Department (HOSPITAL_COMMUNITY): Payer: BC Managed Care – PPO

## 2023-05-18 ENCOUNTER — Encounter (HOSPITAL_COMMUNITY): Payer: Self-pay

## 2023-05-18 ENCOUNTER — Emergency Department (HOSPITAL_COMMUNITY)
Admission: EM | Admit: 2023-05-18 | Discharge: 2023-05-18 | Disposition: A | Discharge status: Home / Self Care | Payer: BC Managed Care – PPO | Attending: Emergency Medicine | Admitting: Emergency Medicine

## 2023-05-18 ENCOUNTER — Other Ambulatory Visit: Payer: Self-pay

## 2023-05-18 DIAGNOSIS — S3609XA Other injury of spleen, initial encounter: Secondary | ICD-10-CM | POA: Diagnosis not present

## 2023-05-18 DIAGNOSIS — R109 Unspecified abdominal pain: Secondary | ICD-10-CM | POA: Insufficient documentation

## 2023-05-18 DIAGNOSIS — S3690XA Unspecified injury of unspecified intra-abdominal organ, initial encounter: Secondary | ICD-10-CM | POA: Diagnosis not present

## 2023-05-18 DIAGNOSIS — M542 Cervicalgia: Secondary | ICD-10-CM | POA: Insufficient documentation

## 2023-05-18 DIAGNOSIS — S301XXA Contusion of abdominal wall, initial encounter: Secondary | ICD-10-CM | POA: Diagnosis not present

## 2023-05-18 DIAGNOSIS — S60221A Contusion of right hand, initial encounter: Secondary | ICD-10-CM | POA: Diagnosis not present

## 2023-05-18 DIAGNOSIS — S0990XA Unspecified injury of head, initial encounter: Secondary | ICD-10-CM | POA: Diagnosis not present

## 2023-05-18 DIAGNOSIS — S8992XA Unspecified injury of left lower leg, initial encounter: Secondary | ICD-10-CM | POA: Diagnosis not present

## 2023-05-18 DIAGNOSIS — Y92410 Unspecified street and highway as the place of occurrence of the external cause: Secondary | ICD-10-CM | POA: Diagnosis not present

## 2023-05-18 DIAGNOSIS — S199XXA Unspecified injury of neck, initial encounter: Secondary | ICD-10-CM | POA: Diagnosis not present

## 2023-05-18 DIAGNOSIS — R0789 Other chest pain: Secondary | ICD-10-CM | POA: Diagnosis not present

## 2023-05-18 DIAGNOSIS — S8002XA Contusion of left knee, initial encounter: Secondary | ICD-10-CM | POA: Diagnosis not present

## 2023-05-18 DIAGNOSIS — Z90710 Acquired absence of both cervix and uterus: Secondary | ICD-10-CM | POA: Diagnosis not present

## 2023-05-18 DIAGNOSIS — S3993XA Unspecified injury of pelvis, initial encounter: Secondary | ICD-10-CM | POA: Diagnosis not present

## 2023-05-18 DIAGNOSIS — S3991XA Unspecified injury of abdomen, initial encounter: Secondary | ICD-10-CM | POA: Diagnosis not present

## 2023-05-18 DIAGNOSIS — S299XXA Unspecified injury of thorax, initial encounter: Secondary | ICD-10-CM | POA: Diagnosis not present

## 2023-05-18 DIAGNOSIS — S36118A Other injury of liver, initial encounter: Secondary | ICD-10-CM | POA: Diagnosis not present

## 2023-05-18 DIAGNOSIS — S6991XA Unspecified injury of right wrist, hand and finger(s), initial encounter: Secondary | ICD-10-CM | POA: Diagnosis not present

## 2023-05-18 LAB — CBC WITH DIFFERENTIAL/PLATELET
Abs Immature Granulocytes: 0.08 10*3/uL — ABNORMAL HIGH (ref 0.00–0.07)
Basophils Absolute: 0.1 10*3/uL (ref 0.0–0.1)
Basophils Relative: 1 %
Eosinophils Absolute: 0.2 10*3/uL (ref 0.0–0.5)
Eosinophils Relative: 2 %
HCT: 40.5 % (ref 36.0–46.0)
Hemoglobin: 14.2 g/dL (ref 12.0–15.0)
Immature Granulocytes: 1 %
Lymphocytes Relative: 11 %
Lymphs Abs: 1.7 10*3/uL (ref 0.7–4.0)
MCH: 33.3 pg (ref 26.0–34.0)
MCHC: 35.1 g/dL (ref 30.0–36.0)
MCV: 95.1 fL (ref 80.0–100.0)
Monocytes Absolute: 1 10*3/uL (ref 0.1–1.0)
Monocytes Relative: 6 %
Neutro Abs: 12.3 10*3/uL — ABNORMAL HIGH (ref 1.7–7.7)
Neutrophils Relative %: 79 %
Platelets: 268 10*3/uL (ref 150–400)
RBC: 4.26 MIL/uL (ref 3.87–5.11)
RDW: 12 % (ref 11.5–15.5)
WBC: 15.4 10*3/uL — ABNORMAL HIGH (ref 4.0–10.5)
nRBC: 0 % (ref 0.0–0.2)

## 2023-05-18 LAB — PROTIME-INR
INR: 1.1 (ref 0.8–1.2)
Prothrombin Time: 13.9 s (ref 11.4–15.2)

## 2023-05-18 LAB — COMPREHENSIVE METABOLIC PANEL
ALT: 14 U/L (ref 0–44)
AST: 16 U/L (ref 15–41)
Albumin: 3.9 g/dL (ref 3.5–5.0)
Alkaline Phosphatase: 59 U/L (ref 38–126)
Anion gap: 10 (ref 5–15)
BUN: 7 mg/dL (ref 6–20)
CO2: 21 mmol/L — ABNORMAL LOW (ref 22–32)
Calcium: 9 mg/dL (ref 8.9–10.3)
Chloride: 108 mmol/L (ref 98–111)
Creatinine, Ser: 1.05 mg/dL — ABNORMAL HIGH (ref 0.44–1.00)
GFR, Estimated: >60 mL/min (ref >60)
Glucose, Bld: 113 mg/dL — ABNORMAL HIGH (ref 70–99)
Potassium: 3.4 mmol/L — ABNORMAL LOW (ref 3.5–5.1)
Sodium: 139 mmol/L (ref 135–145)
Total Bilirubin: 0.6 mg/dL (ref 0.3–1.2)
Total Protein: 7.3 g/dL (ref 6.5–8.1)

## 2023-05-18 LAB — I-STAT CHEM 8, ED
BUN: 7 mg/dL (ref 6–20)
Calcium, Ion: 1.14 mmol/L — ABNORMAL LOW (ref 1.15–1.40)
Chloride: 108 mmol/L (ref 98–111)
Creatinine, Ser: 1.1 mg/dL — ABNORMAL HIGH (ref 0.44–1.00)
Glucose, Bld: 112 mg/dL — ABNORMAL HIGH (ref 70–99)
HCT: 41 % (ref 36.0–46.0)
Hemoglobin: 13.9 g/dL (ref 12.0–15.0)
Potassium: 3.5 mmol/L (ref 3.5–5.1)
Sodium: 141 mmol/L (ref 135–145)
TCO2: 20 mmol/L — ABNORMAL LOW (ref 22–32)

## 2023-05-18 LAB — HCG, SERUM, QUALITATIVE: Preg, Serum: NEGATIVE

## 2023-05-18 LAB — TYPE AND SCREEN
ABO/RH(D): A POS
Antibody Screen: NEGATIVE

## 2023-05-18 MED ORDER — ACETAMINOPHEN 500 MG PO TABS
1000.0000 mg | ORAL_TABLET | Freq: Once | ORAL | Status: AC
  Administered 2023-05-18: 1000 mg via ORAL
  Filled 2023-05-18: qty 2

## 2023-05-18 MED ORDER — FENTANYL CITRATE PF 50 MCG/ML IJ SOSY
50.0000 ug | PREFILLED_SYRINGE | Freq: Once | INTRAMUSCULAR | Status: AC
  Administered 2023-05-18: 50 ug via INTRAVENOUS
  Filled 2023-05-18: qty 1

## 2023-05-18 MED ORDER — OXYCODONE HCL 5 MG PO TABS
5.0000 mg | ORAL_TABLET | Freq: Once | ORAL | Status: AC
  Administered 2023-05-18: 5 mg via ORAL
  Filled 2023-05-18: qty 1

## 2023-05-18 MED ORDER — OXYCODONE-ACETAMINOPHEN 5-325 MG PO TABS: 1.0000 | ORAL_TABLET | Freq: Four times a day (QID) | ORAL | 0 refills | Status: DC | PRN

## 2023-05-18 MED ORDER — KETOROLAC TROMETHAMINE 30 MG/ML IJ SOLN
30.0000 mg | Freq: Once | INTRAMUSCULAR | Status: AC
  Administered 2023-05-18: 30 mg via INTRAVENOUS
  Filled 2023-05-18: qty 1

## 2023-05-18 MED ORDER — ONDANSETRON HCL 4 MG PO TABS: 4.0000 mg | ORAL_TABLET | Freq: Four times a day (QID) | ORAL | 0 refills | Status: AC

## 2023-05-18 MED ORDER — ONDANSETRON HCL 4 MG/2ML IJ SOLN
4.0000 mg | Freq: Once | INTRAMUSCULAR | Status: AC
  Administered 2023-05-18: 4 mg via INTRAVENOUS
  Filled 2023-05-18: qty 2

## 2023-05-18 MED ORDER — IOHEXOL 350 MG/ML SOLN
75.0000 mL | Freq: Once | INTRAVENOUS | Status: AC | PRN
  Administered 2023-05-18: 75 mL via INTRAVENOUS
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
## 2023-05-18 NOTE — Discharge Instructions (Addendum)
While you were in the emergency department, you had essentially head to toe imaging.  All of your films were negative.  You can expect tomorrow to feel more sore than you are today.  I would recommend taking at 1000 mg of Tylenol every 8 hours, 400 mg of ibuprofen every 6 hours.  You can take 5 mg of oxycodone as needed for severe pain.  Return to the emergency department if you lose consciousness, or unable to walk.  Follow-up with your primary care doctor.

## 2023-05-18 NOTE — ED Notes (Signed)
Went to CT
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
## 2023-06-02 DIAGNOSIS — R051 Acute cough: Secondary | ICD-10-CM | POA: Diagnosis not present

## 2023-06-04 ENCOUNTER — Ambulatory Visit: Payer: BC Managed Care – PPO | Attending: Cardiology | Admitting: Cardiology

## 2023-06-04 ENCOUNTER — Encounter: Payer: Self-pay | Admitting: Cardiology

## 2023-06-04 VITALS — BP 116/78 | HR 72 | Ht 62.0 in | Wt 133.0 lb

## 2023-06-04 DIAGNOSIS — I6523 Occlusion and stenosis of bilateral carotid arteries: Secondary | ICD-10-CM | POA: Diagnosis not present

## 2023-06-04 DIAGNOSIS — Z720 Tobacco use: Secondary | ICD-10-CM

## 2023-06-04 DIAGNOSIS — E782 Mixed hyperlipidemia: Secondary | ICD-10-CM

## 2023-06-04 DIAGNOSIS — E785 Hyperlipidemia, unspecified: Secondary | ICD-10-CM

## 2023-06-04 DIAGNOSIS — I10 Essential (primary) hypertension: Secondary | ICD-10-CM

## 2023-06-04 DIAGNOSIS — R002 Palpitations: Secondary | ICD-10-CM | POA: Diagnosis not present

## 2023-06-04 NOTE — Patient Instructions (Signed)
Medication Instructions:  Your physician recommends that you continue on your current medications as directed. Please refer to the Current Medication list given to you today.  *If you need a refill on your cardiac medications before your next appointment, please call your pharmacy*   Lab Work: NONE If you have labs (blood work) drawn today and your tests are completely normal, you will receive your results only by: MyChart Message (if you have MyChart) OR A paper copy in the mail If you have any lab test that is abnormal or we need to change your treatment, we will call you to review the results.   Testing/Procedures: Your physician has requested that you have a carotid duplex. This test is an ultrasound of the carotid arteries in your neck. It looks at blood flow through these arteries that supply the brain with blood. Allow one hour for this exam. There are no restrictions or special instructions.   We will order CT coronary calcium score. It will cost $99.00 and iis due at time of scan.  Please call to schedule.     [facility] [address] [place], [place] [address] [phone]  Or  [facility] [address] [place], [place] [address] [phone] option [phone]    Follow-Up: At [facility], you and your health needs are our priority.  As part of our continuing mission to provide you with exceptional heart care, we have created designated Provider Care Teams.  These Care Teams include your primary Cardiologist (physician) and Advanced Practice Providers (APPs -  Physician Assistants and Nurse Practitioners) who all work together to provide you with the care you need, when you need it.  We recommend signing up for the patient portal called "MyChart".  Sign up information is provided on this After Visit Summary.  MyChart is used to connect with patients for Virtual Visits (Telemedicine).  Patients are able to view lab/test results, encounter notes,  upcoming appointments, etc.  Non-urgent messages can be sent to your provider as well.   To learn more about what you can do with MyChart, go to [email or web address].    Your next appointment:   6 month(s)  Provider:   Gypsy Balsam, MD    Other Instructions
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
## 2023-06-09 ENCOUNTER — Ambulatory Visit: Payer: BC Managed Care – PPO | Admitting: Cardiology

## 2023-07-22 ENCOUNTER — Encounter: Payer: Self-pay | Admitting: Cardiology

## 2023-08-25 ENCOUNTER — Other Ambulatory Visit: Payer: Self-pay | Admitting: Obstetrics and Gynecology

## 2023-08-25 DIAGNOSIS — Z1231 Encounter for screening mammogram for malignant neoplasm of breast: Secondary | ICD-10-CM

## 2023-09-02 DIAGNOSIS — G43911 Migraine, unspecified, intractable, with status migrainosus: Secondary | ICD-10-CM | POA: Diagnosis not present

## 2023-09-07 DIAGNOSIS — G43909 Migraine, unspecified, not intractable, without status migrainosus: Secondary | ICD-10-CM | POA: Diagnosis not present

## 2023-09-07 DIAGNOSIS — E785 Hyperlipidemia, unspecified: Secondary | ICD-10-CM | POA: Diagnosis not present

## 2023-09-07 DIAGNOSIS — E559 Vitamin D deficiency, unspecified: Secondary | ICD-10-CM | POA: Diagnosis not present

## 2023-09-07 DIAGNOSIS — R739 Hyperglycemia, unspecified: Secondary | ICD-10-CM | POA: Diagnosis not present

## 2023-09-20 DIAGNOSIS — R059 Cough, unspecified: Secondary | ICD-10-CM | POA: Diagnosis not present

## 2023-09-20 DIAGNOSIS — R051 Acute cough: Secondary | ICD-10-CM | POA: Diagnosis not present

## 2023-09-20 DIAGNOSIS — J069 Acute upper respiratory infection, unspecified: Secondary | ICD-10-CM | POA: Diagnosis not present

## 2023-09-22 DIAGNOSIS — J069 Acute upper respiratory infection, unspecified: Secondary | ICD-10-CM | POA: Diagnosis not present

## 2023-09-22 DIAGNOSIS — R059 Cough, unspecified: Secondary | ICD-10-CM | POA: Diagnosis not present

## 2023-09-22 DIAGNOSIS — Z6824 Body mass index (BMI) 24.0-24.9, adult: Secondary | ICD-10-CM | POA: Diagnosis not present

## 2023-10-27 ENCOUNTER — Telehealth: Payer: Self-pay | Admitting: Cardiology

## 2023-10-27 NOTE — Telephone Encounter (Signed)
 Pt is calling to schedule carotid duplex but there is not an order in the system. Please advise

## 2023-11-03 ENCOUNTER — Ambulatory Visit
Admission: RE | Admit: 2023-11-03 | Discharge: 2023-11-03 | Disposition: A | Discharge status: Home / Self Care | Payer: BC Managed Care – PPO | Source: Ambulatory Visit | Attending: Obstetrics and Gynecology | Admitting: Obstetrics and Gynecology

## 2023-11-03 DIAGNOSIS — Z1231 Encounter for screening mammogram for malignant neoplasm of breast: Secondary | ICD-10-CM

## 2023-11-04 NOTE — Telephone Encounter (Signed)
 Order reinstated, patient scheduled [place] and follow up

## 2023-11-22 DIAGNOSIS — Z6823 Body mass index (BMI) 23.0-23.9, adult: Secondary | ICD-10-CM | POA: Diagnosis not present

## 2023-11-22 DIAGNOSIS — Z01419 Encounter for gynecological examination (general) (routine) without abnormal findings: Secondary | ICD-10-CM | POA: Diagnosis not present

## 2023-11-22 DIAGNOSIS — Z1329 Encounter for screening for other suspected endocrine disorder: Secondary | ICD-10-CM | POA: Diagnosis not present

## 2023-11-22 DIAGNOSIS — R6882 Decreased libido: Secondary | ICD-10-CM | POA: Diagnosis not present

## 2023-12-02 ENCOUNTER — Ambulatory Visit: Attending: Cardiology

## 2023-12-02 DIAGNOSIS — G43909 Migraine, unspecified, not intractable, without status migrainosus: Secondary | ICD-10-CM | POA: Diagnosis not present

## 2023-12-02 DIAGNOSIS — I6523 Occlusion and stenosis of bilateral carotid arteries: Secondary | ICD-10-CM

## 2023-12-02 DIAGNOSIS — E559 Vitamin D deficiency, unspecified: Secondary | ICD-10-CM | POA: Diagnosis not present

## 2023-12-02 DIAGNOSIS — R739 Hyperglycemia, unspecified: Secondary | ICD-10-CM | POA: Diagnosis not present

## 2023-12-02 DIAGNOSIS — E785 Hyperlipidemia, unspecified: Secondary | ICD-10-CM | POA: Diagnosis not present

## 2023-12-02 DIAGNOSIS — Z79899 Other long term (current) drug therapy: Secondary | ICD-10-CM | POA: Diagnosis not present

## 2023-12-07 ENCOUNTER — Ambulatory Visit: Payer: Self-pay | Admitting: Cardiology

## 2023-12-07 DIAGNOSIS — I6523 Occlusion and stenosis of bilateral carotid arteries: Secondary | ICD-10-CM

## 2023-12-07 DIAGNOSIS — F32A Depression, unspecified: Secondary | ICD-10-CM | POA: Insufficient documentation

## 2023-12-07 DIAGNOSIS — J189 Pneumonia, unspecified organism: Secondary | ICD-10-CM | POA: Insufficient documentation

## 2023-12-07 DIAGNOSIS — R569 Unspecified convulsions: Secondary | ICD-10-CM | POA: Insufficient documentation

## 2023-12-10 ENCOUNTER — Ambulatory Visit: Admitting: Cardiology

## 2023-12-28 DIAGNOSIS — M79604 Pain in right leg: Secondary | ICD-10-CM | POA: Diagnosis not present

## 2023-12-28 DIAGNOSIS — I10 Essential (primary) hypertension: Secondary | ICD-10-CM | POA: Diagnosis not present

## 2023-12-28 DIAGNOSIS — F419 Anxiety disorder, unspecified: Secondary | ICD-10-CM | POA: Diagnosis not present

## 2023-12-28 DIAGNOSIS — M79605 Pain in left leg: Secondary | ICD-10-CM | POA: Diagnosis not present

## 2023-12-30 ENCOUNTER — Other Ambulatory Visit: Payer: Self-pay | Admitting: Oncology

## 2023-12-30 DIAGNOSIS — D72829 Elevated white blood cell count, unspecified: Secondary | ICD-10-CM

## 2023-12-30 NOTE — Progress Notes (Deleted)
 [facility]  [address] [place],  [place]  [address] [phone]  Clinic Day:  12/31/2023  Referring physician: Venancio Pock, PA-C   HISTORY OF PRESENT ILLNESS:  The patient is a 43 y.o. female who I was asked to consult upon for leukocytosis.  Recent labs showed a borderline elevated white count of 10.7, with 67% being neutrophils.  Her neutrophil count was minimally elevated at 7200.    PAST MEDICAL HISTORY:   Past Medical History:  Diagnosis Date  . Acute renal failure syndrome (HCC) 09/18/2020  . Anxiety 05/29/2019  . Atopic dermatitis 09/18/2020  . Atypical chest pain 06/05/2019  . Bipolar affective disorder, currently depressed, moderate (HCC) 09/18/2020  . Bite of nonvenomous arthropod 09/18/2020  . Carotid artery stenosis 2023   moderate, bilateral  . Compulsive behavior 09/18/2020  . Depression   . Depressive disorder 05/29/2019  . Dyspnea on exertion 03/31/2019  . Essential hypertension 03/31/2019  . Generalized anxiety disorder 09/18/2020  . Hypertension   . Insomnia 03/31/2019  . Irregular menstruation 09/18/2020  . Irritable bowel syndrome with diarrhea 09/18/2020  . Major depression, single episode 09/18/2020  . Migraines   . Mixed hyperlipidemia 09/18/2020  . Osteoporosis 09/18/2020  . Other long term (current) drug therapy 09/18/2020  . Palpitations 03/31/2019  . Pneumonia   . Premenstrual dysphoric disorder 05/29/2019  . Psoriasis 09/18/2020  . Raynaud's disease   . Refractory migraine with aura 09/18/2020  . Rheumatoid arthritis (HCC) 03/31/2019  . [place] spotted fever 01/17/2019  . Seizures (HCC)    for tking medication Viibryd  . Tachycardia 09/18/2020  . Vitamin D deficiency 09/18/2020    PAST SURGICAL HISTORY:   Past Surgical History:  Procedure Laterality Date  . ANKLE SURGERY Right   . AUGMENTATION MAMMAPLASTY    . BREAST ENHANCEMENT SURGERY  2015  . IVF  2012  . TUBAL LIGATION    .  UPPER GASTROINTESTINAL ENDOSCOPY    . VAGINAL HYSTERECTOMY     still have ovaries  . WISDOM TOOTH EXTRACTION      CURRENT MEDICATIONS:   Current Outpatient Medications  Medication Sig Dispense Refill  . clonazePAM (KLONOPIN) 0.5 MG tablet Take 0.5 mg by mouth 2 (two) times daily.    . cloNIDine  (CATAPRES ) 0.1 MG tablet Take 1 tablet (0.1 mg total) by mouth 2 (two) times daily. (Patient taking differently: Take 0.1 mg by mouth See admin instructions. Bid/prn for elevated heart rate) 180 tablet 3  . NURTEC 75 MG TBDP Take 1 tablet by mouth at bedtime as needed (migraines).    . omeprazole  (PRILOSEC) 40 MG capsule Take 1 capsule (40 mg total) by mouth daily. 90 capsule 3  . ondansetron  (ZOFRAN ) 4 MG tablet Take 1 tablet (4 mg total) by mouth every 6 (six) hours. 12 tablet 0  . oxyCODONE -acetaminophen  (PERCOCET/ROXICET) 5-325 MG tablet Take 1 tablet by mouth every 6 (six) hours as needed for severe pain (pain score 7-10). 6 tablet 0  . promethazine  (PHENERGAN ) 25 MG tablet Take 1 tablet by mouth every 12 (twelve) hours as needed for nausea.    . propranolol  (INDERAL ) 40 MG tablet Take 1 tablet (40 mg total) by mouth 2 (two) times daily. 180 tablet 3  . tiZANidine (ZANAFLEX) 4 MG tablet Take 4 mg by mouth 2 (two) times daily as needed for spasms.    . traZODone (DESYREL) 150 MG tablet Take 150 mg by mouth at bedtime as needed for sleep.  No current facility-administered medications for this visit.    ALLERGIES:   Allergies  Allergen Reactions  . Adalimumab Hives    Acute renal failure    . Baclofen     Hallucinations   . Prednisone Other (See Comments)    Brittle  bones   . Dilaudid  [Hydromorphone ]     Headache  . Sumatriptan Other (See Comments)  . Topiramate Other (See Comments)  . Topiramate Er Other (See Comments)  . Vilazodone     seizures   . Vilazodone Hcl Other (See Comments)  . Amitriptyline Hcl Rash  . Sulfamethazine Rash    FAMILY HISTORY:   Family History   Problem Relation Age of Onset  . Diabetes Mother   . Thyroid  disease Mother   . Heart disease Mother   . Hypertension Father   . Prostate cancer Father   . Hyperlipidemia Father   . Anxiety disorder Sister   . Irritable bowel syndrome Sister   . Diabetes Maternal Aunt   . Diabetes Maternal Uncle   . Diabetes Maternal Grandmother   . Hypertension Maternal Grandmother   . Kidney disease Maternal Grandmother   . Heart disease Maternal Grandmother   . Liver disease Maternal Grandmother   . Heart attack Maternal Grandmother        silent x2  . Alzheimer's disease Maternal Grandmother   . Alzheimer's disease Paternal Grandmother   . Pancreatic cancer Maternal Great-grandmother   . Colon cancer Neg Hx   . Esophageal cancer Neg Hx   . Rectal cancer Neg Hx   . Stomach cancer Neg Hx   . Breast cancer Neg Hx   . BRCA 1/2 Neg Hx     SOCIAL HISTORY:   reports that she has been smoking cigarettes. She has never used smokeless tobacco. She reports that she does not drink alcohol and does not use drugs.  REVIEW OF SYSTEMS:  Review of Systems - Oncology   PHYSICAL EXAM:   Last menstrual period 10/29/2016. Wt Readings from Last 3 Encounters:  06/04/23 133 lb (60.3 kg)  05/18/23 140 lb (63.5 kg)  10/31/21 145 lb (65.8 kg)   There is no height or weight on file to calculate BMI. Performance status (ECOG): {CHL ONC [id number] Physical Exam  LABS:      Latest Ref Rng & Units 05/18/2023    7:39 PM 05/18/2023    7:33 PM 08/06/2021   11:56 AM  CBC  WBC 4.0 - 10.5 K/uL  15.4  11.6   Hemoglobin 12.0 - 15.0 g/dL 86.0  85.7  86.6   Hematocrit 36.0 - 46.0 % 41.0  40.5  39.6   Platelets 150 - 400 K/uL  268  239.0       Latest Ref Rng & Units 05/18/2023    7:39 PM 05/18/2023    7:33 PM 08/06/2021   11:56 AM  CMP  Glucose 70 - 99 mg/dL 887  886  78   BUN 6 - 20 mg/dL 7  7  9    Creatinine 0.44 - 1.00 mg/dL 8.89  8.94  8.95   Sodium 135 - 145 mmol/L 141  139  140   Potassium  3.5 - 5.1 mmol/L 3.5  3.4  3.2   Chloride 98 - 111 mmol/L 108  108  105   CO2 22 - 32 mmol/L  21  28   Calcium 8.9 - 10.3 mg/dL  9.0  8.9   Total Protein 6.5 - 8.1 g/dL  7.3  6.8  Total Bilirubin 0.3 - 1.2 mg/dL  0.6  0.3   Alkaline Phos 38 - 126 U/L  59  68   AST 15 - 41 U/L  16  12   ALT 0 - 44 U/L  14  12      No results found for: CEA1, CEA / No results found for: CEA1, CEA No results found for: PSA1 No results found for: CAN199 No results found for: CAN125  No results found for: TOTALPROTELP, ALBUMINELP, A1GS, A2GS, BETS, BETA2SER, GAMS, MSPIKE, SPEI No results found for: TIBC, FERRITIN, IRONPCTSAT No results found for: LDH  No results found for: AFPTUMOR, TOTALPROTELP, ALBUMINELP, A1GS, A2GS, BETS, BETA2SER, GAMS, MSPIKE, SPEI, LDH, CEA1, CEA, PSA1, IGASERUM, IGGSERUM, IGMSERUM, THGAB, THYROGLB  Review Flowsheet        No data to display          STUDIES:  VAS US  CAROTID Result Date: 12/07/2023 Carotid Arterial Duplex Study Patient Name:  Mary Dean  Date of Exam:   12/02/2023 Medical Rec #: [id number]        Accession #:    [id number] Date of Birth: [birth year]        Patient Gender: F Patient Age:   58 years Exam Location:  [place] Procedure:      VAS US  CAROTID Referring Phys: DELON WOODY --------------------------------------------------------------------------------  Risk Factors:      Hyperlipidemia. Other Factors:     Chest pain, Tachycardia. Comparison Study:  No significant change since previous exam performed 12/02/2021 Performing Technologist: Lynwood Reel RDCS  Examination Guidelines: A complete evaluation includes B-mode imaging, spectral Doppler, color Doppler, and power Doppler as needed of all accessible portions of each vessel. Bilateral testing is considered an integral part of a complete examination. Limited examinations for reoccurring indications may be performed as noted.  Right  Carotid Findings: +----------+--------+--------+--------+------------------+--------+           PSV cm/sEDV cm/sStenosisPlaque DescriptionComments +----------+--------+--------+--------+------------------+--------+ CCA Prox  76      32                                         +----------+--------+--------+--------+------------------+--------+ CCA Distal77      37                                         +----------+--------+--------+--------+------------------+--------+ ICA Prox  88      48      1-39%   hyperechoic                +----------+--------+--------+--------+------------------+--------+ ICA Mid   73      36                                         +----------+--------+--------+--------+------------------+--------+ ICA Distal73      36                                         +----------+--------+--------+--------+------------------+--------+ ECA       120     49                                         +----------+--------+--------+--------+------------------+--------+ +----------+--------+-------+----------------+-------------------+  PSV cm/sEDV cmsDescribe        Arm Pressure (mmHG) +----------+--------+-------+----------------+-------------------+ [id number]             Multiphasic, [id number]                 +----------+--------+-------+----------------+-------------------+ +---------+--------+--+--------+--+---------+ VertebralPSV cm/s40EDV cm/s22Antegrade +---------+--------+--+--------+--+---------+  Left Carotid Findings: +----------+--------+--------+--------+------------------+--------+           PSV cm/sEDV cm/sStenosisPlaque DescriptionComments +----------+--------+--------+--------+------------------+--------+ CCA Prox  95      42                                         +----------+--------+--------+--------+------------------+--------+ CCA Distal82      47                                          +----------+--------+--------+--------+------------------+--------+ ICA Prox  88      48      1-39%   hyperechoic                +----------+--------+--------+--------+------------------+--------+ ICA Mid   66      32                                         +----------+--------+--------+--------+------------------+--------+ ICA Distal85      48                                         +----------+--------+--------+--------+------------------+--------+ ECA       123     50                                         +----------+--------+--------+--------+------------------+--------+ +----------+--------+--------+----------------+-------------------+           PSV cm/sEDV cm/sDescribe        Arm Pressure (mmHG) +----------+--------+--------+----------------+-------------------+ [id number]              Multiphasic, [id number]                 +----------+--------+--------+----------------+-------------------+ +---------+--------+--+--------+--+---------+ VertebralPSV cm/s45EDV cm/s24Antegrade +---------+--------+--+--------+--+---------+   Summary: Right Carotid: Velocities in the right ICA are consistent with a 1-39% stenosis. Left Carotid: Velocities in the left ICA are consistent with a 1-39% stenosis. Vertebrals:  Bilateral vertebral arteries demonstrate antegrade flow. Subclavians: Normal flow hemodynamics were seen in bilateral subclavian              arteries. *See table(s) above for measurements and observations.  Electronically signed by Lamar Fitch MD on 12/07/2023 at 10:00:38 AM.    Final      ASSESSMENT & PLAN:  A 43 y.o. female who I was asked to consult upon for *** .The patient understands all the plans discussed today and is in agreement with them.  I do appreciate O'Buch, Greta, PA-C for his new consult.   Mary Lepp DELENA Kerns, MD

## 2023-12-31 ENCOUNTER — Inpatient Hospital Stay

## 2023-12-31 ENCOUNTER — Inpatient Hospital Stay: Attending: Oncology | Admitting: Oncology

## 2023-12-31 ENCOUNTER — Encounter: Payer: Self-pay | Admitting: Oncology

## 2023-12-31 VITALS — HR 84 | Temp 98.0°F | Resp 16 | Ht 62.0 in | Wt 137.6 lb

## 2023-12-31 DIAGNOSIS — D72829 Elevated white blood cell count, unspecified: Secondary | ICD-10-CM | POA: Diagnosis not present

## 2023-12-31 LAB — CBC WITH DIFFERENTIAL (CANCER CENTER ONLY)
Abs Immature Granulocytes: 0.03 10*3/uL (ref 0.00–0.07)
Basophils Absolute: 0.1 10*3/uL (ref 0.0–0.1)
Basophils Relative: 1 %
Eosinophils Absolute: 0.5 10*3/uL (ref 0.0–0.5)
Eosinophils Relative: 6 %
HCT: 38.7 % (ref 36.0–46.0)
Hemoglobin: 13.8 g/dL (ref 12.0–15.0)
Immature Granulocytes: 0 %
Lymphocytes Relative: 26 %
Lymphs Abs: 2.2 10*3/uL (ref 0.7–4.0)
MCH: 32.5 pg (ref 26.0–34.0)
MCHC: 35.7 g/dL (ref 30.0–36.0)
MCV: 91.1 fL (ref 80.0–100.0)
Monocytes Absolute: 0.5 10*3/uL (ref 0.1–1.0)
Monocytes Relative: 5 %
Neutro Abs: 5.3 10*3/uL (ref 1.7–7.7)
Neutrophils Relative %: 62 %
Platelet Count: 274 10*3/uL (ref 150–400)
RBC: 4.25 MIL/uL (ref 3.87–5.11)
RDW: 12.5 % (ref 11.5–15.5)
WBC Count: 8.6 10*3/uL (ref 4.0–10.5)
nRBC: 0 % (ref 0.0–0.2)

## 2024-01-21 ENCOUNTER — Encounter: Payer: Self-pay | Admitting: Cardiology

## 2024-01-21 ENCOUNTER — Ambulatory Visit: Attending: Cardiology | Admitting: Cardiology

## 2024-01-21 VITALS — BP 100/80 | HR 88 | Ht 62.0 in | Wt 134.0 lb

## 2024-01-21 DIAGNOSIS — R0609 Other forms of dyspnea: Secondary | ICD-10-CM | POA: Diagnosis not present

## 2024-01-21 DIAGNOSIS — I6523 Occlusion and stenosis of bilateral carotid arteries: Secondary | ICD-10-CM | POA: Diagnosis not present

## 2024-01-21 DIAGNOSIS — I10 Essential (primary) hypertension: Secondary | ICD-10-CM | POA: Diagnosis not present

## 2024-01-21 DIAGNOSIS — E782 Mixed hyperlipidemia: Secondary | ICD-10-CM

## 2024-01-21 DIAGNOSIS — Z720 Tobacco use: Secondary | ICD-10-CM | POA: Diagnosis not present

## 2024-01-21 NOTE — Patient Instructions (Signed)
 Medication Instructions:  Your physician recommends that you continue on your current medications as directed. Please refer to the Current Medication list given to you today.  *If you need a refill on your cardiac medications before your next appointment, please call your pharmacy*   Lab Work: Your physician recommends that you have a CMP and direct LDL today in the office.  If you have labs (blood work) drawn today and your tests are completely normal, you will receive your results only by: MyChart Message (if you have MyChart) OR A paper copy in the mail If you have any lab test that is abnormal or we need to change your treatment, we will call you to review the results.  Testing/Procedures: Your physician has requested that you have an echocardiogram for September. Echocardiography is a painless test that uses sound waves to create images of your heart. It provides your doctor with information about the size and shape of your heart and how well your heart's chambers and valves are working. This procedure takes approximately one hour. There are no restrictions for this procedure. Please do NOT wear cologne, perfume, aftershave, or lotions (deodorant is allowed). Please arrive 15 minutes prior to your appointment time.  Please note: We ask at that you not bring children with you during ultrasound (echo/ vascular) testing. Due to room size and safety concerns, children are not allowed in the ultrasound rooms during exams. Our front office staff cannot provide observation of children in our lobby area while testing is being conducted. An adult accompanying a patient to their appointment will only be allowed in the ultrasound room at the discretion of the ultrasound technician under special circumstances. We apologize for any inconvenience.  Follow-Up: At [facility], you and your health needs are our priority.  As part of our continuing mission to provide you with exceptional heart care, we  have created designated Provider Care Teams.  These Care Teams include your primary Cardiologist (physician) and Advanced Practice Providers (APPs -  Physician Assistants and Nurse Practitioners) who all work together to provide you with the care you need, when you need it.  We recommend signing up for the patient portal called MyChart.  Sign up information is provided on this After Visit Summary.  MyChart is used to connect with patients for Virtual Visits (Telemedicine).  Patients are able to view lab/test results, encounter notes, upcoming appointments, etc.  Non-urgent messages can be sent to your provider as well.   To learn more about what you can do with MyChart, go to [email or web address].    Your next appointment:   12 month(s)  The format for your next appointment:   In Person  Provider:   Lamar Fitch, MD   Other Instructions Echocardiogram An echocardiogram is a test that uses sound waves (ultrasound) to produce images of the heart. Images from an echocardiogram can provide important information about: Heart size and shape. The size and thickness and movement of your heart's walls. Heart muscle function and strength. Heart valve function or if you have stenosis. Stenosis is when the heart valves are too narrow. If blood is flowing backward through the heart valves (regurgitation). A tumor or infectious growth around the heart valves. Areas of heart muscle that are not working well because of poor blood flow or injury from a heart attack. Aneurysm detection. An aneurysm is a weak or damaged part of an artery wall. The wall bulges out from the normal force of blood pumping through the body. Tell a  health care provider about: Any allergies you have. All medicines you are taking, including vitamins, herbs, eye drops, creams, and over-the-counter medicines. Any blood disorders you have. Any surgeries you have had. Any medical conditions you have. Whether you are  pregnant or may be pregnant. What are the risks? Generally, this is a safe test. However, problems may occur, including an allergic reaction to dye (contrast) that may be used during the test. What happens before the test? No specific preparation is needed. You may eat and drink normally. What happens during the test? You will take off your clothes from the waist up and put on a hospital gown. Electrodes or electrocardiogram (ECG)patches may be placed on your chest. The electrodes or patches are then connected to a device that monitors your heart rate and rhythm. You will lie down on a table for an ultrasound exam. A gel will be applied to your chest to help sound waves pass through your skin. A handheld device, called a transducer, will be pressed against your chest and moved over your heart. The transducer produces sound waves that travel to your heart and bounce back (or echo back) to the transducer. These sound waves will be captured in real-time and changed into images of your heart that can be viewed on a video monitor. The images will be recorded on a computer and reviewed by your health care provider. You may be asked to change positions or hold your breath for a short time. This makes it easier to get different views or better views of your heart. In some cases, you may receive contrast through an IV in one of your veins. This can improve the quality of the pictures from your heart. The procedure may vary among health care providers and hospitals.   What can I expect after the test? You may return to your normal, everyday life, including diet, activities, and medicines, unless your health care provider tells you not to do that. Follow these instructions at home: It is up to you to get the results of your test. Ask your health care provider, or the department that is doing the test, when your results will be ready. Keep all follow-up visits. This is important. Summary An echocardiogram  is a test that uses sound waves (ultrasound) to produce images of the heart. Images from an echocardiogram can provide important information about the size and shape of your heart, heart muscle function, heart valve function, and other possible heart problems. You do not need to do anything to prepare before this test. You may eat and drink normally. After the echocardiogram is completed, you may return to your normal, everyday life, unless your health care provider tells you not to do that. This information is not intended to replace advice given to you by your health care provider. Make sure you discuss any questions you have with your health care provider. Document Revised: 03/05/2020 Document Reviewed: 03/05/2020 Elsevier Patient Education  2021 Elsevier Inc.   Important Information About Sugar
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
## 2024-01-22 LAB — COMPREHENSIVE METABOLIC PANEL WITH GFR
ALT: 7 IU/L (ref 0–32)
AST: 13 IU/L (ref 0–40)
Albumin: 4.2 g/dL (ref 3.9–4.9)
Alkaline Phosphatase: 87 IU/L (ref 44–121)
BUN/Creatinine Ratio: 6 — ABNORMAL LOW (ref 9–23)
BUN: 7 mg/dL (ref 6–24)
Bilirubin Total: 0.3 mg/dL (ref 0.0–1.2)
CO2: 21 mmol/L (ref 20–29)
Calcium: 9.2 mg/dL (ref 8.7–10.2)
Chloride: 104 mmol/L (ref 96–106)
Creatinine, Ser: 1.12 mg/dL — ABNORMAL HIGH (ref 0.57–1.00)
Globulin, Total: 2.4 g/dL (ref 1.5–4.5)
Glucose: 88 mg/dL (ref 70–99)
Potassium: 4.3 mmol/L (ref 3.5–5.2)
Sodium: 142 mmol/L (ref 134–144)
Total Protein: 6.6 g/dL (ref 6.0–8.5)
eGFR: 63 mL/min/1.73

## 2024-01-22 LAB — LDL CHOLESTEROL, DIRECT: LDL Direct: 57 mg/dL (ref 0–99)

## 2024-01-24 ENCOUNTER — Ambulatory Visit: Payer: Self-pay | Admitting: Cardiology

## 2024-01-24 DIAGNOSIS — E782 Mixed hyperlipidemia: Secondary | ICD-10-CM

## 2024-01-24 DIAGNOSIS — I6523 Occlusion and stenosis of bilateral carotid arteries: Secondary | ICD-10-CM

## 2024-01-24 MED ORDER — EZETIMIBE 10 MG PO TABS: 10.0000 mg | ORAL_TABLET | Freq: Every day | ORAL | 3 refills | Status: AC

## 2024-01-24 NOTE — Telephone Encounter (Signed)
 Pt states that there was a discussion of Zetia vs statin due to her RA. Pt states she will do whichever you think but wanted to make sure. Please advise

## 2024-01-24 NOTE — Telephone Encounter (Signed)
-----   Message from Delon JAYSON Hoover sent at 01/24/2024  4:10 PM EDT ----- Yes we discussed both, she can do Zetia 10 mg daily or Crestor three days per week.   ----- Message ----- From: Oneita Berliner, RN Sent: 01/24/2024   1:47 PM EDT To: Delon JAYSON Hoover, NP  ----- Message from Berliner Oneita, RN sent at 01/24/2024  1:47 PM EDT -----   ----- Message ----- From: Hoover Delon JAYSON, NP Sent: 01/24/2024  10:23 AM EDT To: Lurena Div Ash/Hp Triage  So your bad cholesterol is actually at goal, however, you still have the plaque in your arteries around your neck, so we need to do something.  I suggest Crestor 10 mg three days/week. This will help with inflammation and stabilization of the plaque that is already there.   Repeat FLP, LP(a) and CMET in 6 weeks.  ----- Message ----- From: Rebecka Memos Lab Results In Sent: 01/22/2024   5:38 AM EDT To: Delon JAYSON Hoover, NP

## 2024-01-24 NOTE — Telephone Encounter (Signed)
 Results reviewed with pt as per Delon Hoover NP's note.  Pt verbalized understanding and had no additional questions. Routed to PCP.  Pt opted for Zetia.

## 2024-01-24 NOTE — Telephone Encounter (Signed)
-----   Message from Delon JAYSON Hoover sent at 01/24/2024 10:23 AM EDT ----- So your bad cholesterol is actually at goal, however, you still have the plaque in your arteries around your neck, so we need to do something.  I suggest Crestor 10 mg three days/week. This will help with inflammation and stabilization of the plaque that is already there.   Repeat FLP, LP(a) and CMET in 6 weeks.  ----- Message ----- From: Rebecka Memos Lab Results In Sent: 01/22/2024   5:38 AM EDT To: Delon JAYSON Hoover, NP

## 2024-01-30 DIAGNOSIS — D72829 Elevated white blood cell count, unspecified: Secondary | ICD-10-CM | POA: Insufficient documentation

## 2024-01-30 NOTE — Progress Notes (Addendum)
 [facility]  [address] [place],  [place]  [address] [phone]  Clinic Day: 12/31/2023  Referring physician: Venancio Pock, PA-C  HISTORY OF PRESENT ILLNESS:  The patient is a 43 y.o. female who I was asked to consult upon for leukocytosis.  Recent labs showed a borderline elevated white count of 10.7, with 67% being neutrophils.  Her neutrophil count was minimally elevated at 7200.  According to the patient, she has had an elevated white count for the past 2 years.  She denies having a recent infection which could have precipitated her leukocytosis.  She also denies having any B symptoms which concern her for her leukocytosis potentially being due to an underlying hematologic malignancy.  The patient does have rheumatoid arthritis, but is currently not being treated for it.  She denies having undergone a previous splenectomy.  She also denies being on steroids.  Overall, the patient denies having any significant changes in her health over the past few months.  PAST MEDICAL HISTORY:   Past Medical History:  Diagnosis Date   Acute renal failure syndrome (HCC) 09/18/2020   Anxiety 05/29/2019   Atopic dermatitis 09/18/2020   Atypical chest pain 06/05/2019   Bipolar affective disorder, currently depressed, moderate (HCC) 09/18/2020   Bite of nonvenomous arthropod 09/18/2020   Carotid artery stenosis 2023   moderate, bilateral   Compulsive behavior 09/18/2020   Depression    Depressive disorder 05/29/2019   Dyspnea on exertion 03/31/2019   Essential hypertension 03/31/2019   Generalized anxiety disorder 09/18/2020   Hypertension    Insomnia 03/31/2019   Irregular menstruation 09/18/2020   Irritable bowel syndrome with diarrhea 09/18/2020   Major depression, single episode 09/18/2020   Migraines    Mixed hyperlipidemia 09/18/2020   Osteoporosis 09/18/2020   Other long term (current) drug therapy 09/18/2020   Palpitations 03/31/2019   Pneumonia     Premenstrual dysphoric disorder 05/29/2019   Psoriasis 09/18/2020   Raynaud's disease    Refractory migraine with aura 09/18/2020   Rheumatoid arthritis (HCC) 03/31/2019   [place] spotted fever 01/17/2019   Seizures (HCC)    for tking medication Viibryd   Tachycardia 09/18/2020   Vitamin D deficiency 09/18/2020    PAST SURGICAL HISTORY:   Past Surgical History:  Procedure Laterality Date   ANKLE SURGERY Right    AUGMENTATION MAMMAPLASTY     BREAST ENHANCEMENT SURGERY  2015   IVF  2012   TUBAL LIGATION     UPPER GASTROINTESTINAL ENDOSCOPY     VAGINAL HYSTERECTOMY     still have ovaries   WISDOM TOOTH EXTRACTION      CURRENT MEDICATIONS:   Current Outpatient Medications  Medication Sig Dispense Refill   clonazePAM (KLONOPIN) 0.5 MG tablet Take 0.5 mg by mouth 2 (two) times daily.     cloNIDine  (CATAPRES ) 0.1 MG tablet Take 1 tablet (0.1 mg total) by mouth 2 (two) times daily. 180 tablet 3   docusate sodium  (COLACE) 100 MG capsule Take 100 mg by mouth daily.     ezetimibe  (ZETIA ) 10 MG tablet Take 1 tablet (10 mg total) by mouth daily. 90 tablet 3   Inulin (FIBER CHOICE PO) Take 1 tablet by mouth daily.     NURTEC 75 MG TBDP Take 1 tablet by mouth at bedtime as needed (migraines).     omeprazole  (PRILOSEC) 40 MG capsule Take 1 capsule (40 mg total) by mouth daily. 90 capsule 3   ondansetron  (ZOFRAN ) 4 MG tablet Take 1  tablet (4 mg total) by mouth every 6 (six) hours. 12 tablet 0   promethazine  (PHENERGAN ) 25 MG tablet Take 1 tablet by mouth every 12 (twelve) hours as needed for nausea.     propranolol  (INDERAL ) 40 MG tablet Take 1 tablet (40 mg total) by mouth 2 (two) times daily. 180 tablet 3   tiZANidine (ZANAFLEX) 4 MG tablet Take 4 mg by mouth 2 (two) times daily as needed for spasms.     traZODone (DESYREL) 150 MG tablet Take 150 mg by mouth at bedtime as needed for sleep.     No current facility-administered medications for this visit.    ALLERGIES:    Allergies  Allergen Reactions   Adalimumab Hives    Acute renal failure     Baclofen     Hallucinations    Prednisone Other (See Comments)    Brittle  bones    Dilaudid  [Hydromorphone ]     Headache   Sumatriptan Other (See Comments)   Topiramate Other (See Comments)   Topiramate Er Other (See Comments)   Vilazodone     seizures    Vilazodone Hcl Other (See Comments)   Amitriptyline Hcl Rash   Sulfamethazine Rash    FAMILY HISTORY:   Family History  Problem Relation Age of Onset   Diabetes Mother    Thyroid  disease Mother    Heart disease Mother    Hypertension Father    Prostate cancer Father    Hyperlipidemia Father    Anxiety disorder Sister    Irritable bowel syndrome Sister    Diabetes Maternal Aunt    Diabetes Maternal Uncle    Diabetes Maternal Grandmother    Hypertension Maternal Grandmother    Kidney disease Maternal Grandmother    Heart disease Maternal Grandmother    Liver disease Maternal Grandmother    Heart attack Maternal Grandmother        silent x2   Alzheimer's disease Maternal Grandmother    Alzheimer's disease Paternal Grandmother    Pancreatic cancer Maternal Great-grandmother    Colon cancer Neg Hx    Esophageal cancer Neg Hx    Rectal cancer Neg Hx    Stomach cancer Neg Hx    Breast cancer Neg Hx    BRCA 1/2 Neg Hx    SOCIAL HISTORY:  The patient was born and raised in [place].  She currently lives in the [place] with her husband of 19 years.  They have 2 children.  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  She previously served as a Engineer, civil (consulting) at a local clinic.  She has smoked a pack of cigarettes daily for the past 9 years.  There is no history of alcohol use.  REVIEW OF SYSTEMS:  Review of Systems  Constitutional:  Negative for fatigue and fever.  HENT:   Negative for hearing loss and sore throat.   Eyes:  Negative for eye problems.  Respiratory:  Negative for chest tightness, cough and hemoptysis.   Cardiovascular:   Positive for chest pain and palpitations.  Gastrointestinal:  Negative for abdominal distention, abdominal pain, blood in stool, constipation, diarrhea, nausea and vomiting.  Endocrine: Negative for hot flashes.  Genitourinary:  Negative for difficulty urinating, dysuria, frequency, hematuria and nocturia.   Musculoskeletal:  Negative for arthralgias, back pain, gait problem and myalgias.  Skin: Negative.  Negative for itching and rash.  Neurological:  Positive for headaches. Negative for dizziness, extremity weakness, gait problem, light-headedness and numbness.  Hematological: Negative.   Psychiatric/Behavioral: Negative.  Negative  for depression and suicidal ideas. The patient is not nervous/anxious.      PHYSICAL EXAM:  Pulse 84, temperature 98 F (36.7 C), temperature source Oral, resp. rate 16, height 5' 2 (1.575 m), weight 137 lb 9.6 oz (62.4 kg), last menstrual period 10/29/2016, SpO2 99%. Wt Readings from Last 3 Encounters:  01/21/24 134 lb (60.8 kg)  12/31/23 137 lb 9.6 oz (62.4 kg)  06/04/23 133 lb (60.3 kg)   Body mass index is 25.17 kg/m. Performance status (ECOG): 0 - Asymptomatic Physical Exam Constitutional:      Appearance: Normal appearance. She is not ill-appearing.  HENT:     Mouth/Throat:     Mouth: Mucous membranes are moist.     Pharynx: Oropharynx is clear. No oropharyngeal exudate or posterior oropharyngeal erythema.  Cardiovascular:     Rate and Rhythm: Normal rate and regular rhythm.     Heart sounds: No murmur heard.    No friction rub. No gallop.  Pulmonary:     Effort: Pulmonary effort is normal. No respiratory distress.     Breath sounds: Normal breath sounds. No wheezing, rhonchi or rales.  Abdominal:     General: Bowel sounds are normal. There is no distension.     Palpations: Abdomen is soft. There is no mass.     Tenderness: There is no abdominal tenderness.  Musculoskeletal:        General: No swelling.     Right lower leg: No edema.      Left lower leg: No edema.  Lymphadenopathy:     Cervical: No cervical adenopathy.     Upper Body:     Right upper body: No supraclavicular or axillary adenopathy.     Left upper body: No supraclavicular or axillary adenopathy.     Lower Body: No right inguinal adenopathy. No left inguinal adenopathy.  Skin:    General: Skin is warm.     Coloration: Skin is not jaundiced.     Findings: No lesion or rash.  Neurological:     General: No focal deficit present.     Mental Status: She is alert and oriented to person, place, and time. Mental status is at baseline.  Psychiatric:        Mood and Affect: Mood normal.        Behavior: Behavior normal.        Thought Content: Thought content normal.     LABS:      Latest Ref Rng & Units 12/31/2023    3:01 PM 05/18/2023    7:39 PM 05/18/2023    7:33 PM  CBC  WBC 4.0 - 10.5 K/uL 8.6   15.4   Hemoglobin 12.0 - 15.0 g/dL 86.1  86.0  85.7   Hematocrit 36.0 - 46.0 % 38.7  41.0  40.5   Platelets 150 - 400 K/uL 274   268     Latest Reference Range & Units 12/31/23 15:01  Neutrophils % 62  Lymphocytes % 26  Monocytes Relative % 5  Eosinophil % 6  Basophil % 1  Immature Granulocytes % 0      Latest Ref Rng & Units 01/21/2024    4:25 PM 05/18/2023    7:39 PM 05/18/2023    7:33 PM  CMP  Glucose 70 - 99 mg/dL 88  887  886   BUN 6 - 24 mg/dL 7  7  7    Creatinine 0.57 - 1.00 mg/dL 8.87  8.89  8.94   Sodium 134 - 144 mmol/L  142  141  139   Potassium 3.5 - 5.2 mmol/L 4.3  3.5  3.4   Chloride 96 - 106 mmol/L 104  108  108   CO2 20 - 29 mmol/L 21   21   Calcium 8.7 - 10.2 mg/dL 9.2   9.0   Total Protein 6.0 - 8.5 g/dL 6.6   7.3   Total Bilirubin 0.0 - 1.2 mg/dL 0.3   0.6   Alkaline Phos 44 - 121 IU/L 87   59   AST 0 - 40 IU/L 13   16   ALT 0 - 32 IU/L 7   14    ASSESSMENT & PLAN:  A 43 y.o. female who I was asked to consult upon for leukocytosis.  However, her labs today show a completely normal white count.  Furthermore, she has a  completely normal white count differential.  I am also pleased as her hemoglobin and platelets are normal.  Overall, I do not sense anything particularly ominous from a hematologic perspective which has me concerned.  It very well could be that her smoking may be contributing to her previous leukocytosis, for which she was encouraged to abstain.  Otherwise, as I see no other pertinent hematologic issues, I do feel comfortable turning her care back over to her primary care office with recommendation that her CBC be checked, at most, 1-2 times per year.  I would not have a problem seeing her in the future if new hematologic issues arise that require repeat clinical assessment.  The patient understands all the plans discussed today and is in agreement with them.  I do appreciate O'Buch, Greta, PA-C for his new consult.   Veyda Kaufman DELENA Kerns, MD

## 2024-02-21 DIAGNOSIS — M069 Rheumatoid arthritis, unspecified: Secondary | ICD-10-CM | POA: Diagnosis not present

## 2024-02-21 DIAGNOSIS — G43909 Migraine, unspecified, not intractable, without status migrainosus: Secondary | ICD-10-CM | POA: Diagnosis not present

## 2024-02-21 DIAGNOSIS — I10 Essential (primary) hypertension: Secondary | ICD-10-CM | POA: Diagnosis not present

## 2024-04-21 ENCOUNTER — Ambulatory Visit

## 2024-04-27 ENCOUNTER — Telehealth: Payer: Self-pay | Admitting: Cardiology

## 2024-04-27 NOTE — Telephone Encounter (Signed)
 Left message for the patient to call back.

## 2024-04-27 NOTE — Telephone Encounter (Signed)
 Pt requesting a new order for a Echo.

## 2024-04-28 ENCOUNTER — Other Ambulatory Visit: Payer: Self-pay

## 2024-04-28 DIAGNOSIS — I10 Essential (primary) hypertension: Secondary | ICD-10-CM
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
## 2024-05-06 DIAGNOSIS — K047 Periapical abscess without sinus: Secondary | ICD-10-CM | POA: Diagnosis not present

## 2024-05-06 DIAGNOSIS — R220 Localized swelling, mass and lump, head: Secondary | ICD-10-CM | POA: Diagnosis not present

## 2024-05-25 DIAGNOSIS — M069 Rheumatoid arthritis, unspecified: Secondary | ICD-10-CM | POA: Diagnosis not present

## 2024-05-25 DIAGNOSIS — G43909 Migraine, unspecified, not intractable, without status migrainosus: Secondary | ICD-10-CM | POA: Diagnosis not present

## 2024-05-25 DIAGNOSIS — I10 Essential (primary) hypertension: Secondary | ICD-10-CM | POA: Diagnosis not present

## 2024-05-25 DIAGNOSIS — K219 Gastro-esophageal reflux disease without esophagitis: Secondary | ICD-10-CM | POA: Diagnosis not present

## 2024-06-08 ENCOUNTER — Ambulatory Visit: Attending: Cardiology

## 2024-06-08 DIAGNOSIS — R0609 Other forms of dyspnea: Secondary | ICD-10-CM

## 2024-06-08 LAB — ECHOCARDIOGRAM COMPLETE
AR max vel: 2.45 cm2
AV Area VTI: 2.47 cm2
AV Area mean vel: 2.37 cm2
AV Mean grad: 2.1 mmHg
AV Peak grad: 4.1 mmHg
AV Vena cont: 0.3 cm
Ao pk vel: 1.02 m/s
Area-P 1/2: 4.31 cm2
P 1/2 time: 503 ms
S' Lateral: 3 cm

## 2024-06-27 DIAGNOSIS — M25462 Effusion, left knee: Secondary | ICD-10-CM | POA: Diagnosis not present

## 2024-06-27 DIAGNOSIS — M25562 Pain in left knee: Secondary | ICD-10-CM | POA: Diagnosis not present
